# Patient Record
Sex: Male | Born: 1945
Health system: Southern US, Community
[De-identification: ages and names within clinical notes are randomized; demographics above are authoritative.]

## PROBLEM LIST (undated history)

## (undated) DIAGNOSIS — I10 Essential (primary) hypertension: Secondary | ICD-10-CM

## (undated) DIAGNOSIS — L503 Dermatographic urticaria: Secondary | ICD-10-CM

## (undated) DIAGNOSIS — K900 Celiac disease: Secondary | ICD-10-CM

## (undated) DIAGNOSIS — N529 Male erectile dysfunction, unspecified: Secondary | ICD-10-CM

## (undated) DIAGNOSIS — K635 Polyp of colon: Secondary | ICD-10-CM

## (undated) DIAGNOSIS — K219 Gastro-esophageal reflux disease without esophagitis: Secondary | ICD-10-CM

## (undated) HISTORY — DX: Dermatographic urticaria: L50.3

## (undated) HISTORY — PX: HERNIA REPAIR: SHX51

## (undated) HISTORY — DX: Celiac disease: K90.0

## (undated) HISTORY — DX: Polyp of colon: K63.5

## (undated) HISTORY — DX: Essential (primary) hypertension: I10

## (undated) HISTORY — PX: ESOPHAGOGASTRODUODENOSCOPY: SHX1529

## (undated) HISTORY — DX: Male erectile dysfunction, unspecified: N52.9

## (undated) HISTORY — DX: Gastro-esophageal reflux disease without esophagitis: K21.9

## (undated) HISTORY — PX: NOSE SURGERY: SHX723

## (undated) HISTORY — PX: CATARACT EXTRACTION: SUR2

---

## 1999-02-12 ENCOUNTER — Ambulatory Visit (HOSPITAL_BASED_OUTPATIENT_CLINIC_OR_DEPARTMENT_OTHER): Admission: RE | Admit: 1999-02-12 | Discharge: 1999-02-12 | Payer: Self-pay | Admitting: General Surgery

## 2001-10-30 ENCOUNTER — Encounter: Payer: Self-pay | Admitting: *Deleted

## 2001-10-30 ENCOUNTER — Ambulatory Visit (HOSPITAL_COMMUNITY): Admission: RE | Admit: 2001-10-30 | Discharge: 2001-10-30 | Payer: Self-pay | Admitting: *Deleted

## 2004-08-09 ENCOUNTER — Encounter: Admission: RE | Admit: 2004-08-09 | Discharge: 2004-11-07 | Payer: Self-pay | Admitting: Dermatology

## 2004-10-22 ENCOUNTER — Encounter: Admission: RE | Admit: 2004-10-22 | Discharge: 2004-10-22 | Payer: Self-pay | Admitting: Gastroenterology

## 2008-03-28 ENCOUNTER — Emergency Department (HOSPITAL_COMMUNITY): Admission: EM | Admit: 2008-03-28 | Discharge: 2008-03-28 | Payer: Self-pay | Admitting: Emergency Medicine

## 2010-07-04 ENCOUNTER — Ambulatory Visit
Admission: RE | Admit: 2010-07-04 | Discharge: 2010-07-04 | Payer: Self-pay | Source: Home / Self Care | Attending: Family Medicine | Admitting: Family Medicine

## 2010-08-13 ENCOUNTER — Other Ambulatory Visit: Payer: Self-pay | Admitting: Gastroenterology

## 2010-10-01 ENCOUNTER — Other Ambulatory Visit: Payer: Self-pay | Admitting: Gastroenterology

## 2010-10-01 ENCOUNTER — Ambulatory Visit
Admission: RE | Admit: 2010-10-01 | Discharge: 2010-10-01 | Disposition: A | Discharge status: Home / Self Care | Payer: Managed Care, Other (non HMO) | Source: Ambulatory Visit | Attending: Gastroenterology | Admitting: Gastroenterology

## 2010-10-01 DIAGNOSIS — R05 Cough: Secondary | ICD-10-CM

## 2010-10-01 DIAGNOSIS — R062 Wheezing: Secondary | ICD-10-CM

## 2010-10-26 ENCOUNTER — Encounter: Payer: Self-pay | Admitting: Internal Medicine

## 2010-10-29 ENCOUNTER — Encounter: Payer: Self-pay | Admitting: Internal Medicine

## 2010-10-29 ENCOUNTER — Ambulatory Visit (INDEPENDENT_AMBULATORY_CARE_PROVIDER_SITE_OTHER): Payer: Managed Care, Other (non HMO) | Admitting: Internal Medicine

## 2010-10-29 VITALS — BP 122/78 | HR 61 | Temp 97.8°F | Ht 70.0 in | Wt 187.0 lb

## 2010-10-29 DIAGNOSIS — R0600 Dyspnea, unspecified: Secondary | ICD-10-CM | POA: Insufficient documentation

## 2010-10-29 DIAGNOSIS — R0989 Other specified symptoms and signs involving the circulatory and respiratory systems: Secondary | ICD-10-CM

## 2010-10-29 DIAGNOSIS — K219 Gastro-esophageal reflux disease without esophagitis: Secondary | ICD-10-CM

## 2010-10-29 DIAGNOSIS — R05 Cough: Secondary | ICD-10-CM | POA: Insufficient documentation

## 2010-10-29 NOTE — Patient Instructions (Signed)
GERD (REFLUX)  is an extremely common cause of respiratory symptoms, many times with no significant heartburn at all.    It can be treated with medication, but also with lifestyle changes including avoidance of late meals, excessive alcohol, smoking cessation, and avoid fatty foods, chocolate, peppermint, colas, red wine, and acidic juices such as orange juice.  NO MINT OR MENTHOL PRODUCTS SO NO COUGH DROPS  USE SUGARLESS CANDY INSTEAD (jolley ranchers or Stover's)  NO OIL BASED VITAMINS   Stay on omeprazole Take 30- 60 min before your first and last meals of the day   Symbicort 2 puffs every 12 hours if needed for cough or breathing.    Stop zyrtec and use as needed for itching sneezing runny nose  Work on inhaler technique:  relax and gently blow all the way out then take a nice smooth deep breath back in, triggering the inhaler at same time you start breathing in.  Hold for up to 5 seconds if you can.  Rinse and gargle with water when done   If your mouth or throat starts to bother you,   I suggest you time the inhaler to your dental care and after using the inhaler(s) brush teeth and tongue with a baking soda containing toothpaste and when you rinse this out, gargle with it first to see if this helps your mouth and throat.      If you are not satisfied with control of cough or breathing please return here asap.

## 2010-10-29 NOTE — Progress Notes (Signed)
  Subjective:    Patient ID: Russell Thompson, male    DOB: 01/14/1946, 65 y.o.   MRN: [id number]  HPI   Primary= [facility]  =  [place]  38 yowm never smoker started having respiratory problems in 2010 sporadic but worsedry cough and chest tightness with variable sob in March and September better on inhalers assoc with bad GERD eval rx by [facility]   10/29/2010 Initial pulmonary office eval for sporadic cough that has resolved x 2 weeks-  Pt unable to answer any questions in a direct manor, even yes or no, appears to lack insight into his symptoms x for the timing "always in March and September",  Not convinced any meds make any difference and therefore tends to stop them.  At present he has no symptoms at all on symbicort but is unable to demonstrate more than 10% effective technique.   At present Pt denies any significant sore throat, dysphagia, itching, sneezing,  nasal congestion or excess/ purulent secretions,  fever, chills, sweats, unintended wt loss, pleuritic or exertional cp, hempoptysis, orthopnea pnd or leg swelling.    Also denies any obvious fluctuation of symptoms with weather or environmental changes or other aggravating or alleviating factors.   .  Review of Systems  Constitutional: Negative for fever, chills, activity change, appetite change and unexpected weight change.  HENT: Positive for congestion and sore throat. Negative for rhinorrhea, sneezing, trouble swallowing, dental problem, voice change and postnasal drip.   Eyes: Negative for visual disturbance.  Respiratory: Positive for cough and shortness of breath. Negative for choking.   Cardiovascular: Negative for chest pain and leg swelling.  Gastrointestinal: Positive for abdominal pain. Negative for nausea and vomiting.  Genitourinary: Negative for difficulty urinating.  Musculoskeletal: Negative for arthralgias.  Skin: Negative for rash.  Psychiatric/Behavioral: Negative for behavioral problems and confusion.        Objective:   Physical Exam    pleasant wm with unusual affect and attititude nad    Wt 187 10/29/10 HEENT: nl dentition, turbinates, and orophanx. Nl external ear canals without cough reflex   NECK :  without JVD/Nodes/TM/ nl carotid upstrokes bilaterally   LUNGS: no acc muscle use, clear to A and P bilaterally without cough on insp or exp maneuvers   CV:  RRR  no s3 or murmur or increase in P2, no edema   ABD:  soft and nontender with nl excursion in the supine position. No bruits or organomegaly, bowel sounds nl  MS:  warm without deformities, calf tenderness, cyanosis or clubbing  SKIN: warm and dry without lesions    NEURO:  alert, approp, no deficits     cxr 10/01/10  Reviewed, unrmarkable Assessment & Plan:

## 2010-10-30 ENCOUNTER — Encounter: Payer: Self-pay | Admitting: Internal Medicine

## 2010-10-30 DIAGNOSIS — K219 Gastro-esophageal reflux disease without esophagitis: Secondary | ICD-10-CM | POA: Insufficient documentation

## 2010-10-30 NOTE — Assessment & Plan Note (Signed)
Explained natural history of uri and why it's necessary in patients at risk to treat GERD aggressively  at least  short term   to reduce risk of evolving cyclical cough initially  triggered by epithelial injury and a heightened sensitivty to the effects of any upper airway irritants,  most importantly acid - related.  That is, the more sensitive the epithelium damaged for virus, the more the cough, the more the secondary reflux (especially in those prone to reflux) the more the irritation of the sensitive mucosa and so on in a cyclical pattern.  

## 2010-10-30 NOTE — Assessment & Plan Note (Addendum)
Symptoms are markedly disproportionate to objective findings and not clear this is a lung problem but pt does appear to have difficult airway management issues suggestive of intermittent difficult to control asthma.  DDX of  difficult airways managment all start with A and  include Adherence, Ace Inhibitors, Acid Reflux, Active Sinus Disease, Alpha 1 Antitripsin deficiency, Anxiety masquerading as Airways dz,  ABPA,  allergy(esp in young), Aspiration (esp in elderly), Adverse effects of DPI,  Active smokers, plus two Bs  = Bronchiectasis and Beta blocker use..and one C= CHF   Adherence is always the initial "prime suspect" and is a multilayered concern that requires a "trust but verify" approach in every patient - starting with knowing how to use medications, especially inhalers, correctly, keeping up with refills and understanding the fundamental difference between maintenance and prns vs those medications only taken for a very short course and then stopped and not refilled.   Ok with me for him to use symbicort prn, the way it is used in [place], since states only has problems in March and September, but needs help on mdi.  The proper method of use, as well as anticipated side effects, of this metered-dose inhaler are discussed and demonstrated to the patient. This improved from10% to 50% but no better, but since convinced the 10% worked, 50% should work 5 x as well.  "Acid reflux" (GERD)  may be playing a role, since not addressing non Acid component. Reinforced diet.  .Chronic cough is often simultaneously caused by more than one condition. A single cause has been found from 38 to 82% of the time, multiple causes from 18 to 62%. Multiply caused cough has been the result of three diseases up to 42% of the time.     Therefore Beta blockers and allergies could be destabilizing the airway and if fails to respond to above try \ Bystolic, the most beta -1  selective Beta blocker available in sample form,  with bisoprolol the most selective generic choice  on the market.

## 2012-02-27 ENCOUNTER — Other Ambulatory Visit: Payer: Self-pay | Admitting: Dermatology

## 2012-06-30 ENCOUNTER — Other Ambulatory Visit (HOSPITAL_COMMUNITY): Payer: Self-pay | Admitting: Gastroenterology

## 2012-06-30 DIAGNOSIS — K219 Gastro-esophageal reflux disease without esophagitis: Secondary | ICD-10-CM

## 2012-06-30 DIAGNOSIS — R109 Unspecified abdominal pain: Secondary | ICD-10-CM

## 2012-07-08 ENCOUNTER — Encounter (HOSPITAL_COMMUNITY): Payer: Self-pay

## 2012-07-08 ENCOUNTER — Encounter (HOSPITAL_COMMUNITY)
Admission: RE | Admit: 2012-07-08 | Discharge: 2012-07-08 | Disposition: A | Discharge status: Home / Self Care | Payer: Managed Care, Other (non HMO) | Source: Ambulatory Visit | Attending: Gastroenterology | Admitting: Gastroenterology

## 2012-07-08 DIAGNOSIS — K219 Gastro-esophageal reflux disease without esophagitis: Secondary | ICD-10-CM | POA: Insufficient documentation

## 2012-07-08 DIAGNOSIS — R109 Unspecified abdominal pain: Secondary | ICD-10-CM | POA: Insufficient documentation

## 2012-07-08 MED ORDER — TECHNETIUM TC 99M SULFUR COLLOID
2.1000 | Freq: Once | INTRAVENOUS | Status: AC | PRN
  Administered 2012-07-08: 2.1 via ORAL

## 2012-08-06 ENCOUNTER — Other Ambulatory Visit: Payer: Self-pay | Admitting: Gastroenterology

## 2012-08-06 ENCOUNTER — Ambulatory Visit
Admission: RE | Admit: 2012-08-06 | Discharge: 2012-08-06 | Disposition: A | Discharge status: Home / Self Care | Payer: Managed Care, Other (non HMO) | Source: Ambulatory Visit | Attending: Gastroenterology | Admitting: Gastroenterology

## 2012-08-06 DIAGNOSIS — R062 Wheezing: Secondary | ICD-10-CM

## 2012-08-06 DIAGNOSIS — R05 Cough: Secondary | ICD-10-CM

## 2012-10-09 ENCOUNTER — Other Ambulatory Visit: Payer: Self-pay | Admitting: Family Medicine

## 2012-10-09 DIAGNOSIS — M79662 Pain in left lower leg: Secondary | ICD-10-CM

## 2012-10-12 ENCOUNTER — Ambulatory Visit
Admission: RE | Admit: 2012-10-12 | Discharge: 2012-10-12 | Disposition: A | Discharge status: Home / Self Care | Payer: Managed Care, Other (non HMO) | Source: Ambulatory Visit | Attending: Family Medicine | Admitting: Family Medicine

## 2012-10-12 DIAGNOSIS — M79662 Pain in left lower leg: Secondary | ICD-10-CM

## 2013-05-31 ENCOUNTER — Other Ambulatory Visit: Payer: Self-pay | Admitting: Gastroenterology

## 2013-05-31 DIAGNOSIS — R109 Unspecified abdominal pain: Secondary | ICD-10-CM

## 2013-06-03 ENCOUNTER — Encounter (HOSPITAL_COMMUNITY): Payer: Self-pay | Admitting: Emergency Medicine

## 2013-06-03 ENCOUNTER — Ambulatory Visit
Admission: RE | Admit: 2013-06-03 | Discharge: 2013-06-03 | Disposition: A | Discharge status: Home / Self Care | Payer: Medicare Other | Source: Ambulatory Visit | Attending: Gastroenterology | Admitting: Gastroenterology

## 2013-06-03 ENCOUNTER — Emergency Department (HOSPITAL_COMMUNITY)
Admission: EM | Admit: 2013-06-03 | Discharge: 2013-06-03 | Disposition: A | Discharge status: Home / Self Care | Payer: Medicare Other | Attending: Emergency Medicine | Admitting: Emergency Medicine

## 2013-06-03 DIAGNOSIS — R42 Dizziness and giddiness: Secondary | ICD-10-CM | POA: Insufficient documentation

## 2013-06-03 DIAGNOSIS — R55 Syncope and collapse: Secondary | ICD-10-CM | POA: Insufficient documentation

## 2013-06-03 DIAGNOSIS — T50995A Adverse effect of other drugs, medicaments and biological substances, initial encounter: Secondary | ICD-10-CM | POA: Insufficient documentation

## 2013-06-03 DIAGNOSIS — K219 Gastro-esophageal reflux disease without esophagitis: Secondary | ICD-10-CM | POA: Insufficient documentation

## 2013-06-03 DIAGNOSIS — K900 Celiac disease: Secondary | ICD-10-CM | POA: Insufficient documentation

## 2013-06-03 DIAGNOSIS — Z888 Allergy status to other drugs, medicaments and biological substances status: Secondary | ICD-10-CM | POA: Insufficient documentation

## 2013-06-03 DIAGNOSIS — T508X5A Adverse effect of diagnostic agents, initial encounter: Secondary | ICD-10-CM

## 2013-06-03 DIAGNOSIS — R109 Unspecified abdominal pain: Secondary | ICD-10-CM

## 2013-06-03 DIAGNOSIS — N529 Male erectile dysfunction, unspecified: Secondary | ICD-10-CM | POA: Insufficient documentation

## 2013-06-03 DIAGNOSIS — I10 Essential (primary) hypertension: Secondary | ICD-10-CM | POA: Insufficient documentation

## 2013-06-03 DIAGNOSIS — R32 Unspecified urinary incontinence: Secondary | ICD-10-CM | POA: Insufficient documentation

## 2013-06-03 DIAGNOSIS — R159 Full incontinence of feces: Secondary | ICD-10-CM | POA: Insufficient documentation

## 2013-06-03 DIAGNOSIS — I959 Hypotension, unspecified: Secondary | ICD-10-CM | POA: Insufficient documentation

## 2013-06-03 DIAGNOSIS — T782XXA Anaphylactic shock, unspecified, initial encounter: Secondary | ICD-10-CM

## 2013-06-03 DIAGNOSIS — Z79899 Other long term (current) drug therapy: Secondary | ICD-10-CM | POA: Insufficient documentation

## 2013-06-03 DIAGNOSIS — T783XXA Angioneurotic edema, initial encounter: Secondary | ICD-10-CM | POA: Insufficient documentation

## 2013-06-03 DIAGNOSIS — Z860100 Personal history of colon polyps, unspecified: Secondary | ICD-10-CM | POA: Insufficient documentation

## 2013-06-03 DIAGNOSIS — R21 Rash and other nonspecific skin eruption: Secondary | ICD-10-CM | POA: Insufficient documentation

## 2013-06-03 DIAGNOSIS — R112 Nausea with vomiting, unspecified: Secondary | ICD-10-CM | POA: Insufficient documentation

## 2013-06-03 DIAGNOSIS — Z8719 Personal history of other diseases of the digestive system: Secondary | ICD-10-CM | POA: Insufficient documentation

## 2013-06-03 LAB — CBC WITH DIFFERENTIAL/PLATELET
Hemoglobin: 15.2 g/dL (ref 13.0–17.0)
Lymphocytes Relative: 24 % (ref 12–46)
Lymphs Abs: 2.8 10*3/uL (ref 0.7–4.0)
MCH: 30.2 pg (ref 26.0–34.0)
Monocytes Relative: 5 % (ref 3–12)
Neutro Abs: 7.8 10*3/uL — ABNORMAL HIGH (ref 1.7–7.7)
Neutrophils Relative %: 68 % (ref 43–77)
Platelets: 250 10*3/uL (ref 150–400)
RBC: 5.04 MIL/uL (ref 4.22–5.81)
WBC: 11.5 10*3/uL — ABNORMAL HIGH (ref 4.0–10.5)

## 2013-06-03 LAB — COMPREHENSIVE METABOLIC PANEL
ALT: 19 U/L (ref 0–53)
Alkaline Phosphatase: 92 U/L (ref 39–117)
BUN: 15 mg/dL (ref 6–23)
CO2: 21 mEq/L (ref 19–32)
Chloride: 100 mEq/L (ref 96–112)
GFR calc Af Amer: 83 mL/min — ABNORMAL LOW (ref >90)
Glucose, Bld: 120 mg/dL — ABNORMAL HIGH (ref 70–99)
Potassium: 3.8 mEq/L (ref 3.5–5.1)
Sodium: 134 mEq/L — ABNORMAL LOW (ref 135–145)
Total Bilirubin: 0.4 mg/dL (ref 0.3–1.2)
Total Protein: 6.7 g/dL (ref 6.0–8.3)

## 2013-06-03 MED ORDER — IOHEXOL 300 MG/ML  SOLN
100.0000 mL | Freq: Once | INTRAMUSCULAR | Status: AC | PRN
  Administered 2013-06-03: 100 mL via INTRAVENOUS

## 2013-06-03 MED ORDER — DIPHENHYDRAMINE HCL 50 MG/ML IJ SOLN
25.0000 mg | Freq: Once | INTRAMUSCULAR | Status: AC
  Administered 2013-06-03: 25 mg via INTRAVENOUS
  Filled 2013-06-03: qty 1

## 2013-06-03 MED ORDER — IBUPROFEN 400 MG PO TABS
600.0000 mg | ORAL_TABLET | Freq: Once | ORAL | Status: AC
  Administered 2013-06-03: 600 mg via ORAL
  Filled 2013-06-03 (×2): qty 1

## 2013-06-03 MED ORDER — EPINEPHRINE 0.3 MG/0.3ML IJ SOAJ
0.3000 mg | Freq: Once | INTRAMUSCULAR | Status: AC
  Administered 2013-06-03: 0.3 mg via INTRAMUSCULAR
  Filled 2013-06-03: qty 0.3

## 2013-06-03 NOTE — ED Provider Notes (Signed)
CSN: [id number]     Arrival date & time 06/03/13  1130 History   First MD Initiated Contact with Patient 06/03/13 1143     Chief Complaint  Patient presents with  . Allergic Reaction   (Consider location/radiation/quality/duration/timing/severity/associated sxs/prior Treatment) Patient is a 67 y.o. male presenting with allergic reaction. The history is provided by the patient. No language interpreter was used.  Allergic Reaction Presenting symptoms: no difficulty breathing, no difficulty swallowing and no wheezing   Presenting symptoms comment:  Syncope. Erythematous skin from head to toes.  Pt is a 67 year old male who presents via [facility] s/p syncopal episode, possible allergic reaction from contrast dye. Report from [facility] relays that pt had completed his infused CT and felt like he needed to use the bathroom. He stood up at bedside and became syncopal with reported loss of bowel and bladder. EMS reported hypotension initially of 60/47. He denies any difficulty breathing, shortness of breath, chest pain or swelling of his mouth or lips. He denies itching but reports that his skin has been really red and it has a burning, irritated sensation. No nausea or vomiting reported. He denies any recent illness, fever or chills and has not had a reaction like this before. He reports that his GI doctor sent him for an abdominal CT but he is not sure what he was looking for. He denies abdominal pain or discomfort at this time.      Past Medical History  Diagnosis Date  . Hypertension   . GERD (gastroesophageal reflux disease)   . Esophagitis   . Dermatographism   . Concussion     x 5 in High School  . Colon polyp   . Celiac sprue   . ED (erectile dysfunction)    Past Surgical History  Procedure Laterality Date  . Nose surgery    . Hernia repair    . Cataract extraction      Bilateral  . Esophagogastroduodenoscopy     Family History  Problem Relation Age of Onset  . Colon  polyps Father   . Lung cancer Father   . Cervical cancer Mother    History  Substance Use Topics  . Smoking status: Never Smoker   . Smokeless tobacco: Never Used  . Alcohol Use: No    Review of Systems  Constitutional: Negative for fever and chills.  HENT: Negative for sore throat and trouble swallowing.   Respiratory: Negative for choking, chest tightness, shortness of breath, wheezing and stridor.   Cardiovascular: Negative for chest pain.  Gastrointestinal: Negative for abdominal pain and abdominal distention.  Skin: Positive for color change.  All other systems reviewed and are negative.    Allergies  Review of patient's allergies indicates no known allergies.  Home Medications   Current Outpatient Rx  Name  Route  Sig  Dispense  Refill  . Ascorbic Acid (VITAMIN C) 500 MG tablet   Oral   Take 500 mg by mouth daily.           Marland Kitchen atenolol (TENORMIN) 50 MG tablet   Oral   Take 50 mg by mouth daily.           . budesonide-formoterol (SYMBICORT) 160-4.5 MCG/ACT inhaler   Inhalation   Inhale 2 puffs into the lungs 2 (two) times daily as needed.           . cetirizine (ZYRTEC) 10 MG tablet   Oral   Take 10 mg by mouth daily.           Marland Kitchen  erythromycin (ERY-TAB) 250 MG EC tablet   Oral   Take 250 mg by mouth daily.         Marland Kitchen omeprazole (PRILOSEC) 40 MG capsule   Oral   Take 40 mg by mouth 2 (two) times daily.           . Tadalafil (CIALIS) 2.5 MG TABS   Oral   Take 1 tablet by mouth daily as needed.            BP 127/75  Pulse 76  Resp 15  Ht 5\' 10"  (1.778 m)  Wt 198 lb (89.812 kg)  BMI 28.41 kg/m2  SpO2 97% Physical Exam  Nursing note and vitals reviewed. Constitutional: He is oriented to person, place, and time. He appears well-developed and well-nourished. No distress.  HENT:  Head: Normocephalic and atraumatic.  Mouth/Throat: Oropharynx is clear and moist.  Eyes: Conjunctivae and EOM are normal. Pupils are equal, round, and reactive  to light.  Neck: Normal range of motion. Neck supple. No JVD present. No tracheal deviation present. No thyromegaly present.  Cardiovascular: Normal rate, regular rhythm, normal heart sounds and intact distal pulses.   Pulmonary/Chest: Effort normal and breath sounds normal. No stridor. No respiratory distress. He has no wheezes. He has no rales.  Abdominal: Soft. Bowel sounds are normal. He exhibits no distension. There is no tenderness.  Musculoskeletal: Normal range of motion.  Lymphadenopathy:    He has no cervical adenopathy.  Neurological: He is alert and oriented to person, place, and time. He has normal reflexes.  Skin: There is erythema.  Erythema all over, no edema noted.   Psychiatric: He has a normal mood and affect. His behavior is normal. Judgment and thought content normal.    ED Course  Procedures (including critical care time) Labs Review Labs Reviewed  CBC WITH DIFFERENTIAL  COMPREHENSIVE METABOLIC PANEL   Imaging Review Ct Abdomen Pelvis W Contrast  06/03/2013   CLINICAL DATA:  Abdominal pain  EXAM: CT ABDOMEN AND PELVIS WITH CONTRAST  TECHNIQUE: Multidetector CT imaging of the abdomen and pelvis was performed using the standard protocol following bolus administration of intravenous contrast.  CONTRAST:  100 cc Omnipaque 300  COMPARISON:  None.  FINDINGS: The lung bases are clear with some hyper aeration present. A moderate-sized hiatal hernia is noted. The liver enhances with no focal abnormality and no ductal dilatation is seen. No calcified gallstones are noted within the contracted gallbladder. The pancreas is normal in size and tortuous rectosigmoid colon. The appendix and terminal ileum appear normal.  The pancreatic duct is not dilated. The adrenal glands and spleen are unremarkable. The stomach is otherwise unremarkable the kidneys enhance with no calculus or mass and on delayed images, the pelvocaliceal systems are unremarkable and the proximal ureters are normal  in caliber. The abdominal aorta is normal caliber. No adenopathy is seen.  The urinary bladder is moderately urine distended with no abnormality noted. The prostate is minimally prominent. The rectosigmoid colon is somewhat tortuous, but no obvious colonic abnormality is noted. Much of the colon is decompressed and difficult to evaluate. Terminal ileum is faintly visualized and is unremarkable, and the appendix appears normal, extending cephalad. The lumbar vertebrae are in normal alignment with mild degenerative disc disease at L5-S1.  IMPRESSION: 1. Moderate size hiatal hernia. 2. The appendix and terminal ileum are unremarkable.  3. After completion of the CT, the patient became syncopal losing bladder control. An IV was restarted as well as oxygen, and EMS was  contacted. The patient seen to be stable and was then transferred to the emergency department for further care.   Electronically Signed   By: Dwyane Dee M.D.   On: 06/03/2013 11:15    EKG Interpretation   None       MDM   1. Allergic reaction to contrast dye, initial encounter    Syncopal episode with loss of bowel and bladder, erythematous skin that has a burning and irritated sensation. No edema, shortness of breath or difficulty swallowing. Pt given epi pen 0.3 mg, benadryl 25 mg IV and NS 1 liter bolus. Observed for 5-6 hours after epi pen administration. Pt reports that his symptoms have resolved and he is feeling much better. Pt feels like he is ready to go home. He denies any chest pain, shortness of breath, difficulty breathing or itching. Erythema has also completely resolved. VS stable. No tachycardia, tachypnea or hypoxia.     Irish Elders, NP 06/04/13 1406

## 2013-06-03 NOTE — ED Notes (Addendum)
Pt drank oral contrast and had a syncope episode with Hypotension , stool and urinary incont. Pt was started on Nasal O2 because of O2 sats of 90% on room air.Pt  vitals reported to EMS were  B/P 60/47 hoarseness 84. O2 sats 90%. On arrival to ED Pt trunk ,arms and legs appeared flushed and warm to touch.Russell Thompson

## 2013-06-03 NOTE — ED Provider Notes (Signed)
Medical screening examination/treatment/procedure(s) were conducted as a shared visit with non-physician practitioner(s) and myself.  I personally evaluated the patient during the encounter.  EKG Interpretation   None       67 year old male with an apparent anaphylactic reaction after receiving IV contrast for abdominal CT scan performed at an imaging center. Shortly after the scan was completed, he felt nauseated vomited had a syncopal episode, and became very flushed with red skin.  On my evaluation, still dizzy, skin still flushed, still slightly nauseated. Plan epinephrine for treatment of anaphylaxis.  Will observe in ED.  Clinical Impression: 1. Allergic reaction to contrast dye, initial encounter   2. Anaphylaxis, initial encounter       Candyce Churn, MD 06/04/13 0800

## 2013-06-04 NOTE — ED Provider Notes (Signed)
Medical screening examination/treatment/procedure(s) were conducted as a shared visit with non-physician practitioner(s) and myself.  I personally evaluated the patient during the encounter.   Please see my separate note.     Candyce Churn, MD 06/04/13 [phone]

## 2013-06-30 IMAGING — CR DG CHEST 2V
2 series · 2 of 2 positions shown · non-contrast
Comparison: 10/01/2010

CLINICAL DATA: Cough with wheezing and vomiting at night for 4
days.  Nonsmoker

CHEST - 2 VIEW

[w chest pa]
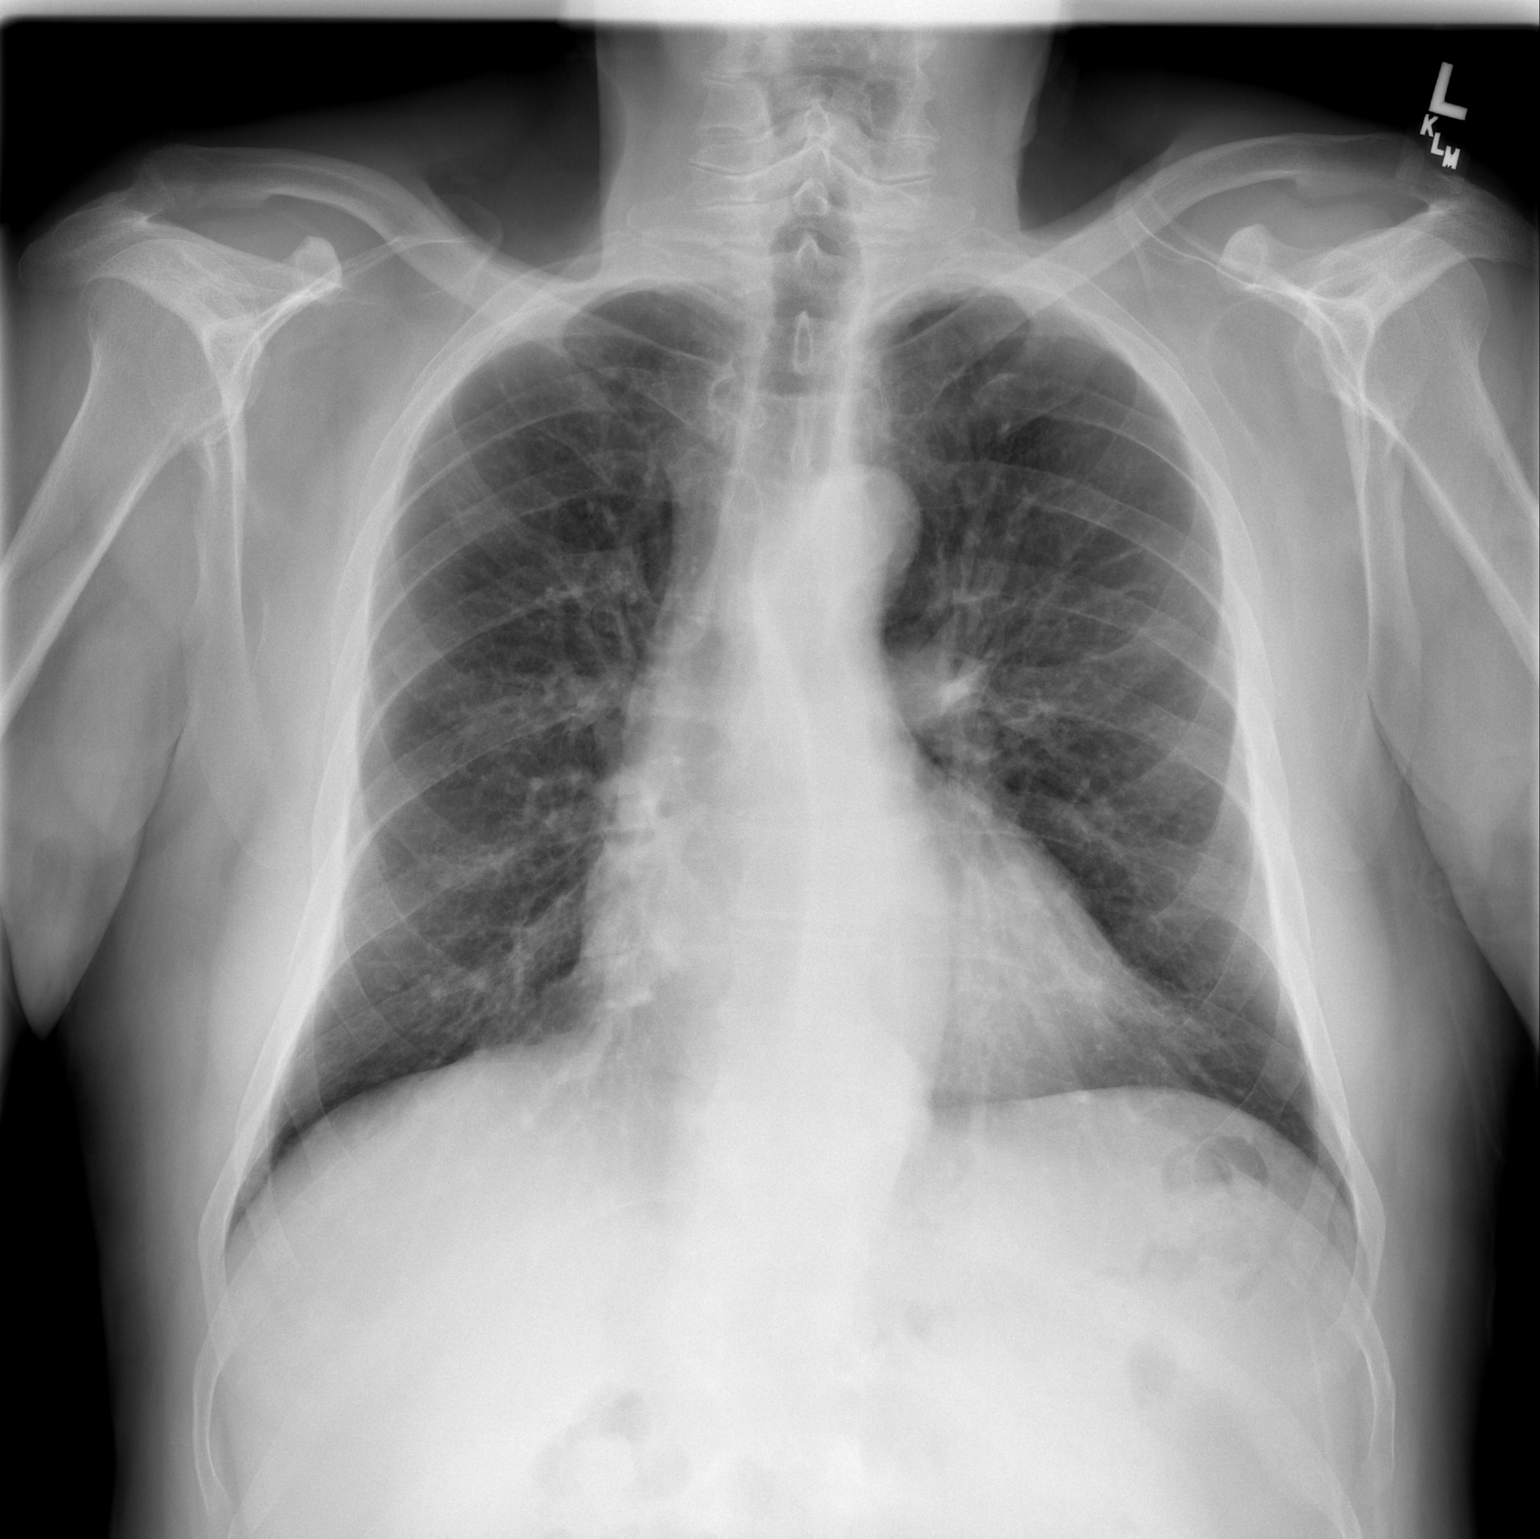

[w chest lat]
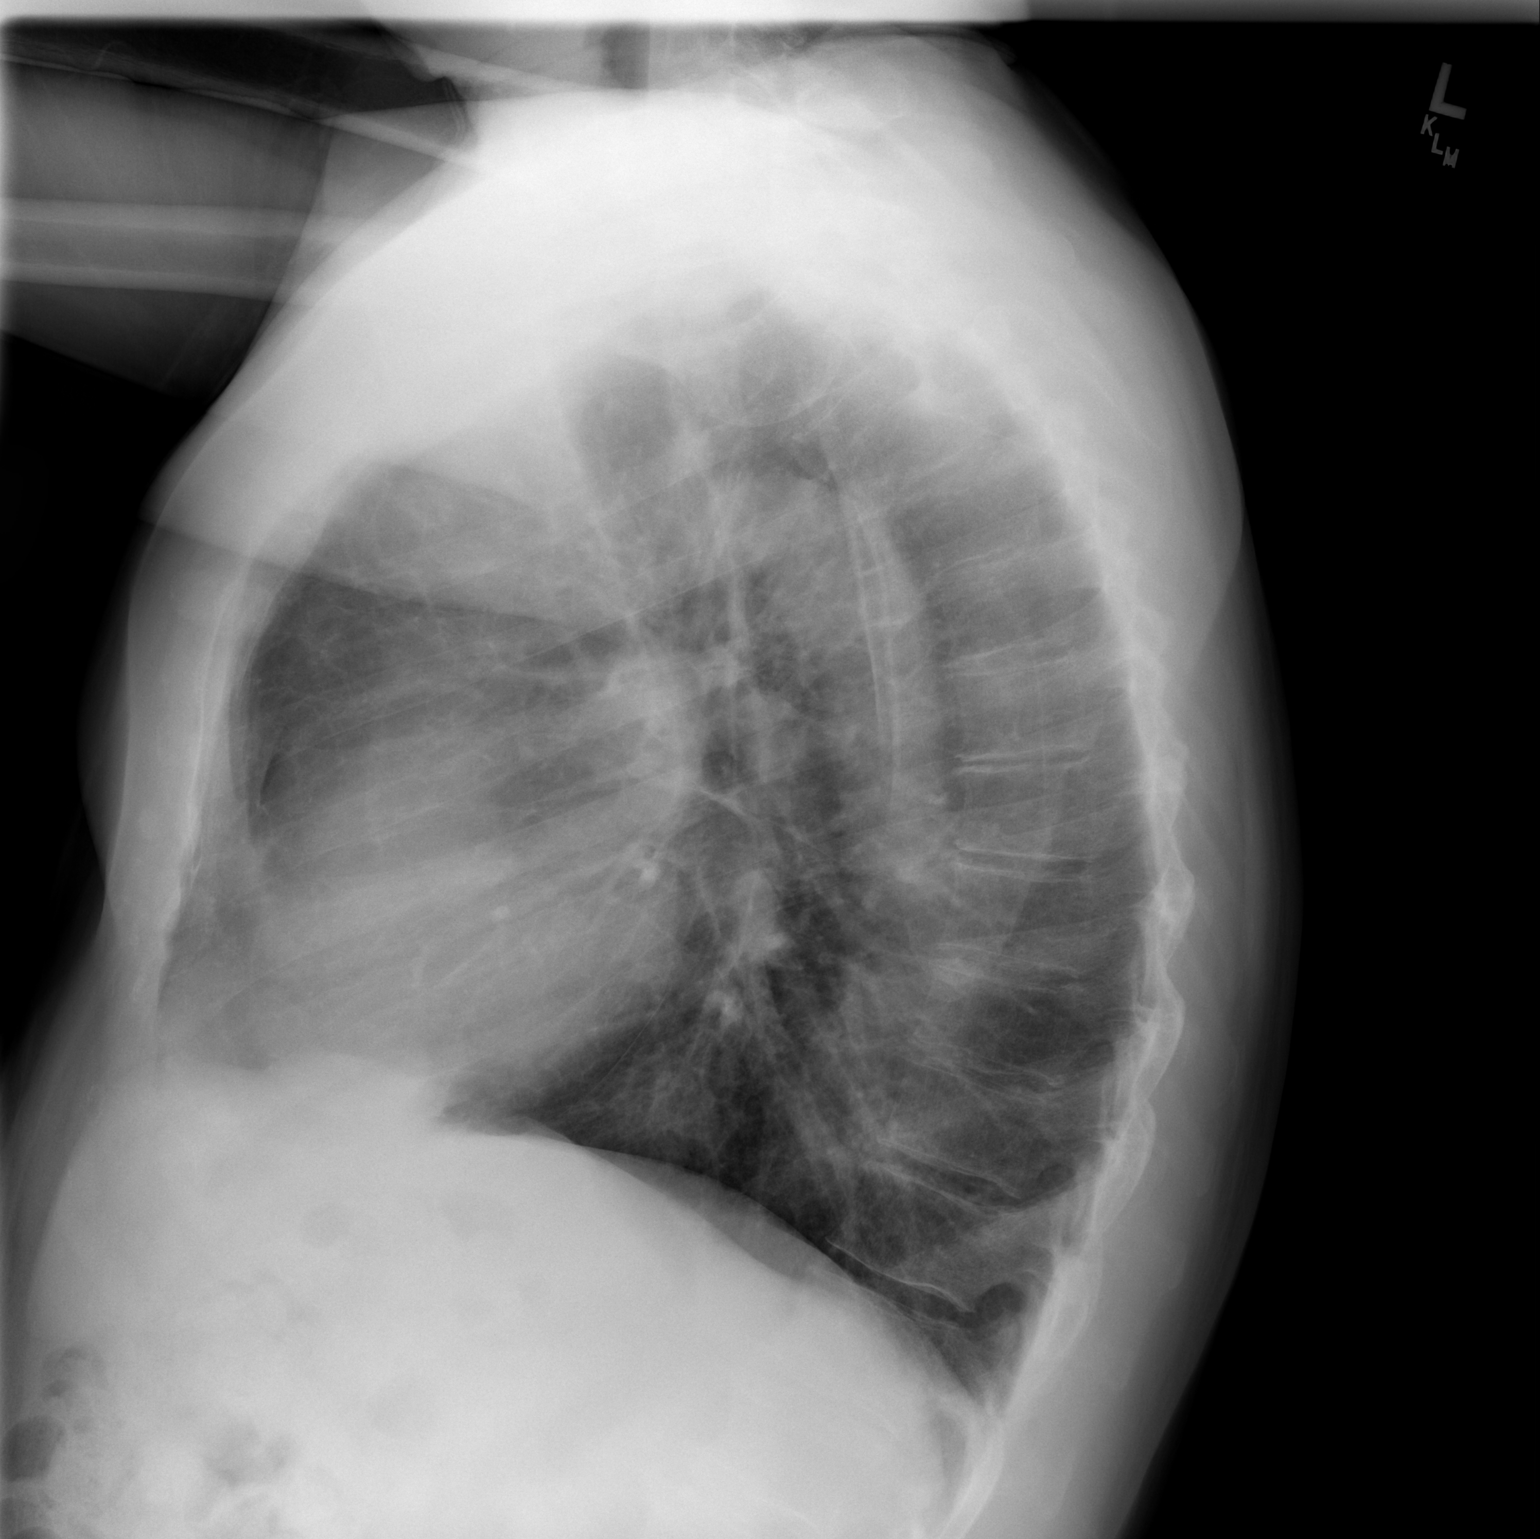

[2 of 2 positions shown; findings below may reference images not displayed]

FINDINGS: Changes of underlying COPD are evident.  Taking this into
consideration, heart and mediastinal contours are stable.  A small
sliding variety hiatal hernia is again noted and mild ectasia of
the thoracic aorta is evident.

The lung fields demonstrate an increase in interstitial markings
diffusely compatible with underlying bronchitic change.  This
finding is stable.  No focal infiltrates or signs of congestive
failure are suggested.  No pleural fluid is seen.  No increase in
central peribronchial markings are noted.

Mild bilateral apical pleural thickening is unchanged.

Bony structures are intact.
IMPRESSION: Unchanged COPD and underlying bronchitic change.  No new focal or
acute abnormality identified.

## 2014-08-10 ENCOUNTER — Other Ambulatory Visit: Payer: Self-pay | Admitting: Gastroenterology

## 2015-07-05 DIAGNOSIS — R05 Cough: Secondary | ICD-10-CM | POA: Diagnosis not present

## 2015-07-05 DIAGNOSIS — L298 Other pruritus: Secondary | ICD-10-CM | POA: Diagnosis not present

## 2015-09-07 DIAGNOSIS — K219 Gastro-esophageal reflux disease without esophagitis: Secondary | ICD-10-CM | POA: Diagnosis not present

## 2015-09-07 DIAGNOSIS — I10 Essential (primary) hypertension: Secondary | ICD-10-CM | POA: Diagnosis not present

## 2015-09-07 DIAGNOSIS — L309 Dermatitis, unspecified: Secondary | ICD-10-CM | POA: Diagnosis not present

## 2015-09-07 DIAGNOSIS — R05 Cough: Secondary | ICD-10-CM | POA: Diagnosis not present

## 2015-10-26 DIAGNOSIS — L130 Dermatitis herpetiformis: Secondary | ICD-10-CM | POA: Diagnosis not present

## 2015-10-26 DIAGNOSIS — L821 Other seborrheic keratosis: Secondary | ICD-10-CM | POA: Diagnosis not present

## 2015-10-26 DIAGNOSIS — L570 Actinic keratosis: Secondary | ICD-10-CM | POA: Diagnosis not present

## 2015-10-26 DIAGNOSIS — Z85828 Personal history of other malignant neoplasm of skin: Secondary | ICD-10-CM | POA: Diagnosis not present

## 2015-11-14 DIAGNOSIS — H26491 Other secondary cataract, right eye: Secondary | ICD-10-CM | POA: Diagnosis not present

## 2015-11-14 DIAGNOSIS — H40013 Open angle with borderline findings, low risk, bilateral: Secondary | ICD-10-CM | POA: Diagnosis not present

## 2015-11-14 DIAGNOSIS — Z961 Presence of intraocular lens: Secondary | ICD-10-CM | POA: Diagnosis not present

## 2015-11-16 DIAGNOSIS — Z961 Presence of intraocular lens: Secondary | ICD-10-CM | POA: Diagnosis not present

## 2015-11-16 DIAGNOSIS — H26491 Other secondary cataract, right eye: Secondary | ICD-10-CM | POA: Diagnosis not present

## 2016-01-29 DIAGNOSIS — L508 Other urticaria: Secondary | ICD-10-CM | POA: Diagnosis not present

## 2016-02-26 DIAGNOSIS — M76891 Other specified enthesopathies of right lower limb, excluding foot: Secondary | ICD-10-CM | POA: Diagnosis not present

## 2016-03-11 DIAGNOSIS — I10 Essential (primary) hypertension: Secondary | ICD-10-CM | POA: Diagnosis not present

## 2016-03-11 DIAGNOSIS — Z125 Encounter for screening for malignant neoplasm of prostate: Secondary | ICD-10-CM | POA: Diagnosis not present

## 2016-03-11 DIAGNOSIS — F5101 Primary insomnia: Secondary | ICD-10-CM | POA: Diagnosis not present

## 2016-03-11 DIAGNOSIS — B356 Tinea cruris: Secondary | ICD-10-CM | POA: Diagnosis not present

## 2016-03-11 DIAGNOSIS — K219 Gastro-esophageal reflux disease without esophagitis: Secondary | ICD-10-CM | POA: Diagnosis not present

## 2016-03-11 DIAGNOSIS — Z23 Encounter for immunization: Secondary | ICD-10-CM | POA: Diagnosis not present

## 2016-03-11 DIAGNOSIS — R05 Cough: Secondary | ICD-10-CM | POA: Diagnosis not present

## 2016-04-10 DIAGNOSIS — Z961 Presence of intraocular lens: Secondary | ICD-10-CM | POA: Diagnosis not present

## 2016-04-10 DIAGNOSIS — H40013 Open angle with borderline findings, low risk, bilateral: Secondary | ICD-10-CM | POA: Diagnosis not present

## 2016-05-01 DIAGNOSIS — L570 Actinic keratosis: Secondary | ICD-10-CM | POA: Diagnosis not present

## 2016-05-01 DIAGNOSIS — Z23 Encounter for immunization: Secondary | ICD-10-CM | POA: Diagnosis not present

## 2016-05-01 DIAGNOSIS — Z85828 Personal history of other malignant neoplasm of skin: Secondary | ICD-10-CM | POA: Diagnosis not present

## 2016-05-01 DIAGNOSIS — L130 Dermatitis herpetiformis: Secondary | ICD-10-CM | POA: Diagnosis not present

## 2016-05-01 DIAGNOSIS — L503 Dermatographic urticaria: Secondary | ICD-10-CM | POA: Diagnosis not present

## 2016-05-01 DIAGNOSIS — D1800 Hemangioma unspecified site: Secondary | ICD-10-CM | POA: Diagnosis not present

## 2016-05-01 DIAGNOSIS — D225 Melanocytic nevi of trunk: Secondary | ICD-10-CM | POA: Diagnosis not present

## 2016-09-09 DIAGNOSIS — I10 Essential (primary) hypertension: Secondary | ICD-10-CM | POA: Diagnosis not present

## 2016-09-09 DIAGNOSIS — K219 Gastro-esophageal reflux disease without esophagitis: Secondary | ICD-10-CM | POA: Diagnosis not present

## 2016-09-09 DIAGNOSIS — B356 Tinea cruris: Secondary | ICD-10-CM | POA: Diagnosis not present

## 2016-10-29 DIAGNOSIS — L280 Lichen simplex chronicus: Secondary | ICD-10-CM | POA: Diagnosis not present

## 2016-10-29 DIAGNOSIS — L821 Other seborrheic keratosis: Secondary | ICD-10-CM | POA: Diagnosis not present

## 2016-10-29 DIAGNOSIS — L309 Dermatitis, unspecified: Secondary | ICD-10-CM | POA: Diagnosis not present

## 2016-10-29 DIAGNOSIS — Z85828 Personal history of other malignant neoplasm of skin: Secondary | ICD-10-CM | POA: Diagnosis not present

## 2016-10-29 DIAGNOSIS — D1800 Hemangioma unspecified site: Secondary | ICD-10-CM | POA: Diagnosis not present

## 2016-10-29 DIAGNOSIS — D225 Melanocytic nevi of trunk: Secondary | ICD-10-CM | POA: Diagnosis not present

## 2016-12-02 DIAGNOSIS — K900 Celiac disease: Secondary | ICD-10-CM | POA: Diagnosis not present

## 2016-12-02 DIAGNOSIS — K219 Gastro-esophageal reflux disease without esophagitis: Secondary | ICD-10-CM | POA: Diagnosis not present

## 2017-02-20 DIAGNOSIS — Z1389 Encounter for screening for other disorder: Secondary | ICD-10-CM | POA: Diagnosis not present

## 2017-02-20 DIAGNOSIS — R600 Localized edema: Secondary | ICD-10-CM | POA: Diagnosis not present

## 2017-02-20 DIAGNOSIS — R52 Pain, unspecified: Secondary | ICD-10-CM | POA: Diagnosis not present

## 2017-03-11 DIAGNOSIS — I10 Essential (primary) hypertension: Secondary | ICD-10-CM | POA: Diagnosis not present

## 2017-03-11 DIAGNOSIS — Z23 Encounter for immunization: Secondary | ICD-10-CM | POA: Diagnosis not present

## 2017-03-11 DIAGNOSIS — Z125 Encounter for screening for malignant neoplasm of prostate: Secondary | ICD-10-CM | POA: Diagnosis not present

## 2017-03-11 DIAGNOSIS — K219 Gastro-esophageal reflux disease without esophagitis: Secondary | ICD-10-CM | POA: Diagnosis not present

## 2017-04-16 DIAGNOSIS — Z961 Presence of intraocular lens: Secondary | ICD-10-CM | POA: Diagnosis not present

## 2017-04-16 DIAGNOSIS — H40013 Open angle with borderline findings, low risk, bilateral: Secondary | ICD-10-CM | POA: Diagnosis not present

## 2017-07-14 DIAGNOSIS — J0100 Acute maxillary sinusitis, unspecified: Secondary | ICD-10-CM | POA: Diagnosis not present

## 2017-09-08 DIAGNOSIS — K219 Gastro-esophageal reflux disease without esophagitis: Secondary | ICD-10-CM | POA: Diagnosis not present

## 2017-09-08 DIAGNOSIS — N5202 Corporo-venous occlusive erectile dysfunction: Secondary | ICD-10-CM | POA: Diagnosis not present

## 2017-09-08 DIAGNOSIS — I10 Essential (primary) hypertension: Secondary | ICD-10-CM | POA: Diagnosis not present

## 2017-09-08 DIAGNOSIS — I872 Venous insufficiency (chronic) (peripheral): Secondary | ICD-10-CM | POA: Diagnosis not present

## 2017-09-16 DIAGNOSIS — R05 Cough: Secondary | ICD-10-CM | POA: Diagnosis not present

## 2017-11-05 DIAGNOSIS — D1800 Hemangioma unspecified site: Secondary | ICD-10-CM | POA: Diagnosis not present

## 2017-11-05 DIAGNOSIS — L723 Sebaceous cyst: Secondary | ICD-10-CM | POA: Diagnosis not present

## 2017-11-05 DIAGNOSIS — D225 Melanocytic nevi of trunk: Secondary | ICD-10-CM | POA: Diagnosis not present

## 2017-11-05 DIAGNOSIS — L570 Actinic keratosis: Secondary | ICD-10-CM | POA: Diagnosis not present

## 2017-11-05 DIAGNOSIS — Z85828 Personal history of other malignant neoplasm of skin: Secondary | ICD-10-CM | POA: Diagnosis not present

## 2017-11-05 DIAGNOSIS — B353 Tinea pedis: Secondary | ICD-10-CM | POA: Diagnosis not present

## 2017-11-05 DIAGNOSIS — D239 Other benign neoplasm of skin, unspecified: Secondary | ICD-10-CM | POA: Diagnosis not present

## 2017-11-05 DIAGNOSIS — L821 Other seborrheic keratosis: Secondary | ICD-10-CM | POA: Diagnosis not present

## 2017-12-18 DIAGNOSIS — K317 Polyp of stomach and duodenum: Secondary | ICD-10-CM | POA: Diagnosis not present

## 2017-12-18 DIAGNOSIS — K449 Diaphragmatic hernia without obstruction or gangrene: Secondary | ICD-10-CM | POA: Diagnosis not present

## 2017-12-18 DIAGNOSIS — K228 Other specified diseases of esophagus: Secondary | ICD-10-CM | POA: Diagnosis not present

## 2017-12-18 DIAGNOSIS — K219 Gastro-esophageal reflux disease without esophagitis: Secondary | ICD-10-CM | POA: Diagnosis not present

## 2017-12-18 DIAGNOSIS — K5710 Diverticulosis of small intestine without perforation or abscess without bleeding: Secondary | ICD-10-CM | POA: Diagnosis not present

## 2017-12-24 DIAGNOSIS — K219 Gastro-esophageal reflux disease without esophagitis: Secondary | ICD-10-CM | POA: Diagnosis not present

## 2017-12-24 DIAGNOSIS — K317 Polyp of stomach and duodenum: Secondary | ICD-10-CM | POA: Diagnosis not present

## 2018-01-26 DIAGNOSIS — R600 Localized edema: Secondary | ICD-10-CM | POA: Diagnosis not present

## 2018-01-27 DIAGNOSIS — I82442 Acute embolism and thrombosis of left tibial vein: Secondary | ICD-10-CM | POA: Diagnosis not present

## 2018-01-28 ENCOUNTER — Other Ambulatory Visit: Payer: Self-pay | Admitting: Family Medicine

## 2018-01-28 ENCOUNTER — Other Ambulatory Visit (HOSPITAL_COMMUNITY): Payer: Self-pay | Admitting: Family Medicine

## 2018-01-28 DIAGNOSIS — I82442 Acute embolism and thrombosis of left tibial vein: Secondary | ICD-10-CM | POA: Diagnosis not present

## 2018-01-29 ENCOUNTER — Ambulatory Visit (HOSPITAL_COMMUNITY)
Admission: RE | Admit: 2018-01-29 | Discharge: 2018-01-29 | Disposition: A | Discharge status: Home / Self Care | Payer: PPO | Source: Ambulatory Visit | Attending: Family Medicine | Admitting: Family Medicine

## 2018-01-29 ENCOUNTER — Other Ambulatory Visit (HOSPITAL_COMMUNITY): Payer: Self-pay | Admitting: Family Medicine

## 2018-01-29 ENCOUNTER — Encounter (HOSPITAL_COMMUNITY)
Admission: RE | Admit: 2018-01-29 | Discharge: 2018-01-29 | Disposition: A | Discharge status: Home / Self Care | Payer: PPO | Source: Ambulatory Visit | Attending: Family Medicine | Admitting: Family Medicine

## 2018-01-29 DIAGNOSIS — I824Z2 Acute embolism and thrombosis of unspecified deep veins of left distal lower extremity: Secondary | ICD-10-CM

## 2018-01-29 DIAGNOSIS — R059 Cough, unspecified: Secondary | ICD-10-CM

## 2018-01-29 DIAGNOSIS — R05 Cough: Secondary | ICD-10-CM | POA: Insufficient documentation

## 2018-01-29 DIAGNOSIS — K449 Diaphragmatic hernia without obstruction or gangrene: Secondary | ICD-10-CM | POA: Diagnosis not present

## 2018-01-29 DIAGNOSIS — I82402 Acute embolism and thrombosis of unspecified deep veins of left lower extremity: Secondary | ICD-10-CM | POA: Diagnosis not present

## 2018-01-29 DIAGNOSIS — Z86718 Personal history of other venous thrombosis and embolism: Secondary | ICD-10-CM | POA: Insufficient documentation

## 2018-01-29 MED ORDER — TECHNETIUM TO 99M ALBUMIN AGGREGATED
4.2000 | Freq: Once | INTRAVENOUS | Status: AC
  Administered 2018-01-29: 4.2 via INTRAVENOUS

## 2018-01-29 MED ORDER — TECHNETIUM TC 99M DIETHYLENETRIAME-PENTAACETIC ACID
31.4000 | Freq: Once | INTRAVENOUS | Status: AC
  Administered 2018-01-29: 31.4 via RESPIRATORY_TRACT

## 2018-03-11 DIAGNOSIS — N5202 Corporo-venous occlusive erectile dysfunction: Secondary | ICD-10-CM | POA: Diagnosis not present

## 2018-03-11 DIAGNOSIS — I10 Essential (primary) hypertension: Secondary | ICD-10-CM | POA: Diagnosis not present

## 2018-03-11 DIAGNOSIS — K219 Gastro-esophageal reflux disease without esophagitis: Secondary | ICD-10-CM | POA: Diagnosis not present

## 2018-03-11 DIAGNOSIS — Z125 Encounter for screening for malignant neoplasm of prostate: Secondary | ICD-10-CM | POA: Diagnosis not present

## 2018-03-11 DIAGNOSIS — I82442 Acute embolism and thrombosis of left tibial vein: Secondary | ICD-10-CM | POA: Diagnosis not present

## 2018-03-11 DIAGNOSIS — Z136 Encounter for screening for cardiovascular disorders: Secondary | ICD-10-CM | POA: Diagnosis not present

## 2018-03-11 DIAGNOSIS — I872 Venous insufficiency (chronic) (peripheral): Secondary | ICD-10-CM | POA: Diagnosis not present

## 2018-04-16 DIAGNOSIS — H40013 Open angle with borderline findings, low risk, bilateral: Secondary | ICD-10-CM | POA: Diagnosis not present

## 2018-04-16 DIAGNOSIS — Z961 Presence of intraocular lens: Secondary | ICD-10-CM | POA: Diagnosis not present

## 2018-04-20 DIAGNOSIS — Z23 Encounter for immunization: Secondary | ICD-10-CM | POA: Diagnosis not present

## 2018-04-20 DIAGNOSIS — I872 Venous insufficiency (chronic) (peripheral): Secondary | ICD-10-CM | POA: Diagnosis not present

## 2018-04-20 DIAGNOSIS — Z86718 Personal history of other venous thrombosis and embolism: Secondary | ICD-10-CM | POA: Diagnosis not present

## 2018-10-20 DIAGNOSIS — J309 Allergic rhinitis, unspecified: Secondary | ICD-10-CM | POA: Diagnosis not present

## 2018-10-20 DIAGNOSIS — K219 Gastro-esophageal reflux disease without esophagitis: Secondary | ICD-10-CM | POA: Diagnosis not present

## 2018-10-20 DIAGNOSIS — I10 Essential (primary) hypertension: Secondary | ICD-10-CM | POA: Diagnosis not present

## 2018-11-04 DIAGNOSIS — L271 Localized skin eruption due to drugs and medicaments taken internally: Secondary | ICD-10-CM | POA: Diagnosis not present

## 2018-11-04 DIAGNOSIS — D225 Melanocytic nevi of trunk: Secondary | ICD-10-CM | POA: Diagnosis not present

## 2018-11-04 DIAGNOSIS — L570 Actinic keratosis: Secondary | ICD-10-CM | POA: Diagnosis not present

## 2018-11-04 DIAGNOSIS — Z85828 Personal history of other malignant neoplasm of skin: Secondary | ICD-10-CM | POA: Diagnosis not present

## 2018-11-04 DIAGNOSIS — D239 Other benign neoplasm of skin, unspecified: Secondary | ICD-10-CM | POA: Diagnosis not present

## 2018-11-04 DIAGNOSIS — L723 Sebaceous cyst: Secondary | ICD-10-CM | POA: Diagnosis not present

## 2018-11-04 DIAGNOSIS — L821 Other seborrheic keratosis: Secondary | ICD-10-CM | POA: Diagnosis not present

## 2019-01-14 DIAGNOSIS — D509 Iron deficiency anemia, unspecified: Secondary | ICD-10-CM | POA: Diagnosis not present

## 2019-01-14 DIAGNOSIS — N400 Enlarged prostate without lower urinary tract symptoms: Secondary | ICD-10-CM | POA: Diagnosis not present

## 2019-01-14 DIAGNOSIS — I10 Essential (primary) hypertension: Secondary | ICD-10-CM | POA: Diagnosis not present

## 2019-02-18 DIAGNOSIS — D509 Iron deficiency anemia, unspecified: Secondary | ICD-10-CM | POA: Diagnosis not present

## 2019-02-18 DIAGNOSIS — N400 Enlarged prostate without lower urinary tract symptoms: Secondary | ICD-10-CM | POA: Diagnosis not present

## 2019-02-18 DIAGNOSIS — I10 Essential (primary) hypertension: Secondary | ICD-10-CM | POA: Diagnosis not present

## 2019-04-19 DIAGNOSIS — K219 Gastro-esophageal reflux disease without esophagitis: Secondary | ICD-10-CM | POA: Diagnosis not present

## 2019-04-19 DIAGNOSIS — Z23 Encounter for immunization: Secondary | ICD-10-CM | POA: Diagnosis not present

## 2019-04-19 DIAGNOSIS — Z125 Encounter for screening for malignant neoplasm of prostate: Secondary | ICD-10-CM | POA: Diagnosis not present

## 2019-04-19 DIAGNOSIS — I10 Essential (primary) hypertension: Secondary | ICD-10-CM | POA: Diagnosis not present

## 2019-04-22 DIAGNOSIS — Z961 Presence of intraocular lens: Secondary | ICD-10-CM | POA: Diagnosis not present

## 2019-04-22 DIAGNOSIS — H40013 Open angle with borderline findings, low risk, bilateral: Secondary | ICD-10-CM | POA: Diagnosis not present

## 2019-07-28 ENCOUNTER — Ambulatory Visit: Payer: PPO

## 2019-08-06 ENCOUNTER — Ambulatory Visit: Payer: PPO

## 2019-08-20 DIAGNOSIS — D509 Iron deficiency anemia, unspecified: Secondary | ICD-10-CM | POA: Diagnosis not present

## 2019-08-20 DIAGNOSIS — N400 Enlarged prostate without lower urinary tract symptoms: Secondary | ICD-10-CM | POA: Diagnosis not present

## 2019-08-20 DIAGNOSIS — I10 Essential (primary) hypertension: Secondary | ICD-10-CM | POA: Diagnosis not present

## 2019-09-06 DIAGNOSIS — K219 Gastro-esophageal reflux disease without esophagitis: Secondary | ICD-10-CM | POA: Diagnosis not present

## 2019-09-06 DIAGNOSIS — Z86010 Personal history of colonic polyps: Secondary | ICD-10-CM | POA: Diagnosis not present

## 2019-09-06 DIAGNOSIS — K900 Celiac disease: Secondary | ICD-10-CM | POA: Diagnosis not present

## 2019-10-04 DIAGNOSIS — Z1159 Encounter for screening for other viral diseases: Secondary | ICD-10-CM | POA: Diagnosis not present

## 2019-10-06 DIAGNOSIS — K635 Polyp of colon: Secondary | ICD-10-CM | POA: Diagnosis not present

## 2019-10-06 DIAGNOSIS — K5730 Diverticulosis of large intestine without perforation or abscess without bleeding: Secondary | ICD-10-CM | POA: Diagnosis not present

## 2019-10-06 DIAGNOSIS — Z86010 Personal history of colonic polyps: Secondary | ICD-10-CM | POA: Diagnosis not present

## 2019-10-08 DIAGNOSIS — K635 Polyp of colon: Secondary | ICD-10-CM | POA: Diagnosis not present

## 2019-10-18 DIAGNOSIS — I10 Essential (primary) hypertension: Secondary | ICD-10-CM | POA: Diagnosis not present

## 2019-10-18 DIAGNOSIS — K219 Gastro-esophageal reflux disease without esophagitis: Secondary | ICD-10-CM | POA: Diagnosis not present

## 2019-11-05 DIAGNOSIS — D225 Melanocytic nevi of trunk: Secondary | ICD-10-CM | POA: Diagnosis not present

## 2019-11-05 DIAGNOSIS — L859 Epidermal thickening, unspecified: Secondary | ICD-10-CM | POA: Diagnosis not present

## 2019-11-05 DIAGNOSIS — L821 Other seborrheic keratosis: Secondary | ICD-10-CM | POA: Diagnosis not present

## 2019-11-05 DIAGNOSIS — Z85828 Personal history of other malignant neoplasm of skin: Secondary | ICD-10-CM | POA: Diagnosis not present

## 2019-11-05 DIAGNOSIS — L723 Sebaceous cyst: Secondary | ICD-10-CM | POA: Diagnosis not present

## 2019-11-05 DIAGNOSIS — L309 Dermatitis, unspecified: Secondary | ICD-10-CM | POA: Diagnosis not present

## 2019-11-05 DIAGNOSIS — L578 Other skin changes due to chronic exposure to nonionizing radiation: Secondary | ICD-10-CM | POA: Diagnosis not present

## 2020-01-10 DIAGNOSIS — J4521 Mild intermittent asthma with (acute) exacerbation: Secondary | ICD-10-CM | POA: Diagnosis not present

## 2020-01-18 ENCOUNTER — Other Ambulatory Visit: Payer: Self-pay

## 2020-01-18 ENCOUNTER — Other Ambulatory Visit (HOSPITAL_COMMUNITY): Payer: Self-pay | Admitting: Nurse Practitioner

## 2020-01-18 ENCOUNTER — Ambulatory Visit (HOSPITAL_COMMUNITY)
Admission: RE | Admit: 2020-01-18 | Discharge: 2020-01-18 | Disposition: A | Discharge status: Home / Self Care | Payer: PPO | Source: Ambulatory Visit | Attending: Family Medicine | Admitting: Family Medicine

## 2020-01-18 DIAGNOSIS — M7989 Other specified soft tissue disorders: Secondary | ICD-10-CM

## 2020-01-18 DIAGNOSIS — M79604 Pain in right leg: Secondary | ICD-10-CM | POA: Diagnosis not present

## 2020-01-18 NOTE — Progress Notes (Signed)
Right lower extremity venous duplex completed. Refer to "CV Proc" under chart review to view preliminary results.  01/18/2020 4:23 PM Kelby Aline., MHA, RVT, RDCS, RDMS

## 2020-04-18 DIAGNOSIS — I10 Essential (primary) hypertension: Secondary | ICD-10-CM | POA: Diagnosis not present

## 2020-04-18 DIAGNOSIS — Z23 Encounter for immunization: Secondary | ICD-10-CM | POA: Diagnosis not present

## 2020-04-18 DIAGNOSIS — N5202 Corporo-venous occlusive erectile dysfunction: Secondary | ICD-10-CM | POA: Diagnosis not present

## 2020-04-18 DIAGNOSIS — K219 Gastro-esophageal reflux disease without esophagitis: Secondary | ICD-10-CM | POA: Diagnosis not present

## 2020-04-27 DIAGNOSIS — H1045 Other chronic allergic conjunctivitis: Secondary | ICD-10-CM | POA: Diagnosis not present

## 2020-04-27 DIAGNOSIS — H40013 Open angle with borderline findings, low risk, bilateral: Secondary | ICD-10-CM | POA: Diagnosis not present

## 2020-04-27 DIAGNOSIS — H0102A Squamous blepharitis right eye, upper and lower eyelids: Secondary | ICD-10-CM | POA: Diagnosis not present

## 2020-04-27 DIAGNOSIS — H0102B Squamous blepharitis left eye, upper and lower eyelids: Secondary | ICD-10-CM | POA: Diagnosis not present

## 2020-04-27 DIAGNOSIS — Z961 Presence of intraocular lens: Secondary | ICD-10-CM | POA: Diagnosis not present

## 2020-06-28 DIAGNOSIS — Z20822 Contact with and (suspected) exposure to covid-19: Secondary | ICD-10-CM | POA: Diagnosis not present

## 2020-08-10 DIAGNOSIS — H00025 Hordeolum internum left lower eyelid: Secondary | ICD-10-CM | POA: Diagnosis not present

## 2020-10-17 DIAGNOSIS — R0789 Other chest pain: Secondary | ICD-10-CM | POA: Diagnosis not present

## 2020-10-17 DIAGNOSIS — Z0000 Encounter for general adult medical examination without abnormal findings: Secondary | ICD-10-CM | POA: Diagnosis not present

## 2020-10-17 DIAGNOSIS — I10 Essential (primary) hypertension: Secondary | ICD-10-CM | POA: Diagnosis not present

## 2020-11-07 DIAGNOSIS — D1801 Hemangioma of skin and subcutaneous tissue: Secondary | ICD-10-CM | POA: Diagnosis not present

## 2020-11-07 DIAGNOSIS — L570 Actinic keratosis: Secondary | ICD-10-CM | POA: Diagnosis not present

## 2020-11-07 DIAGNOSIS — D225 Melanocytic nevi of trunk: Secondary | ICD-10-CM | POA: Diagnosis not present

## 2020-11-07 DIAGNOSIS — Z85828 Personal history of other malignant neoplasm of skin: Secondary | ICD-10-CM | POA: Diagnosis not present

## 2020-11-07 DIAGNOSIS — L859 Epidermal thickening, unspecified: Secondary | ICD-10-CM | POA: Diagnosis not present

## 2020-11-07 DIAGNOSIS — L821 Other seborrheic keratosis: Secondary | ICD-10-CM | POA: Diagnosis not present

## 2020-11-07 DIAGNOSIS — L578 Other skin changes due to chronic exposure to nonionizing radiation: Secondary | ICD-10-CM | POA: Diagnosis not present

## 2020-11-07 DIAGNOSIS — L723 Sebaceous cyst: Secondary | ICD-10-CM | POA: Diagnosis not present

## 2021-04-19 DIAGNOSIS — I10 Essential (primary) hypertension: Secondary | ICD-10-CM | POA: Diagnosis not present

## 2021-04-19 DIAGNOSIS — L308 Other specified dermatitis: Secondary | ICD-10-CM | POA: Diagnosis not present

## 2021-04-19 DIAGNOSIS — K219 Gastro-esophageal reflux disease without esophagitis: Secondary | ICD-10-CM | POA: Diagnosis not present

## 2021-04-19 DIAGNOSIS — Z23 Encounter for immunization: Secondary | ICD-10-CM | POA: Diagnosis not present

## 2021-05-02 DIAGNOSIS — H1045 Other chronic allergic conjunctivitis: Secondary | ICD-10-CM | POA: Diagnosis not present

## 2021-05-02 DIAGNOSIS — H0102B Squamous blepharitis left eye, upper and lower eyelids: Secondary | ICD-10-CM | POA: Diagnosis not present

## 2021-05-02 DIAGNOSIS — H40013 Open angle with borderline findings, low risk, bilateral: Secondary | ICD-10-CM | POA: Diagnosis not present

## 2021-05-02 DIAGNOSIS — Z961 Presence of intraocular lens: Secondary | ICD-10-CM | POA: Diagnosis not present

## 2021-05-02 DIAGNOSIS — H0102A Squamous blepharitis right eye, upper and lower eyelids: Secondary | ICD-10-CM | POA: Diagnosis not present

## 2021-05-02 DIAGNOSIS — H04123 Dry eye syndrome of bilateral lacrimal glands: Secondary | ICD-10-CM | POA: Diagnosis not present

## 2021-05-03 ENCOUNTER — Other Ambulatory Visit: Payer: Self-pay | Admitting: Gastroenterology

## 2021-05-03 DIAGNOSIS — R109 Unspecified abdominal pain: Secondary | ICD-10-CM

## 2021-05-21 ENCOUNTER — Other Ambulatory Visit: Payer: Self-pay

## 2021-05-21 ENCOUNTER — Ambulatory Visit
Admission: RE | Admit: 2021-05-21 | Discharge: 2021-05-21 | Disposition: A | Discharge status: Home / Self Care | Payer: PPO | Source: Ambulatory Visit | Attending: Gastroenterology | Admitting: Gastroenterology

## 2021-05-21 DIAGNOSIS — R109 Unspecified abdominal pain: Secondary | ICD-10-CM | POA: Diagnosis not present

## 2021-05-21 DIAGNOSIS — K449 Diaphragmatic hernia without obstruction or gangrene: Secondary | ICD-10-CM | POA: Diagnosis not present

## 2021-05-21 DIAGNOSIS — K760 Fatty (change of) liver, not elsewhere classified: Secondary | ICD-10-CM | POA: Diagnosis not present

## 2021-05-21 DIAGNOSIS — K8020 Calculus of gallbladder without cholecystitis without obstruction: Secondary | ICD-10-CM | POA: Diagnosis not present

## 2021-06-04 DIAGNOSIS — J392 Other diseases of pharynx: Secondary | ICD-10-CM | POA: Diagnosis not present

## 2021-06-04 DIAGNOSIS — R051 Acute cough: Secondary | ICD-10-CM | POA: Diagnosis not present

## 2021-07-05 DIAGNOSIS — Z13810 Encounter for screening for upper gastrointestinal disorder: Secondary | ICD-10-CM | POA: Diagnosis not present

## 2021-07-05 DIAGNOSIS — K219 Gastro-esophageal reflux disease without esophagitis: Secondary | ICD-10-CM | POA: Diagnosis not present

## 2021-07-05 DIAGNOSIS — R1013 Epigastric pain: Secondary | ICD-10-CM | POA: Diagnosis not present

## 2021-07-05 DIAGNOSIS — K5710 Diverticulosis of small intestine without perforation or abscess without bleeding: Secondary | ICD-10-CM | POA: Diagnosis not present

## 2021-07-05 DIAGNOSIS — K317 Polyp of stomach and duodenum: Secondary | ICD-10-CM | POA: Diagnosis not present

## 2021-07-05 DIAGNOSIS — K449 Diaphragmatic hernia without obstruction or gangrene: Secondary | ICD-10-CM | POA: Diagnosis not present

## 2021-07-12 DIAGNOSIS — K317 Polyp of stomach and duodenum: Secondary | ICD-10-CM | POA: Diagnosis not present

## 2021-07-12 DIAGNOSIS — K219 Gastro-esophageal reflux disease without esophagitis: Secondary | ICD-10-CM | POA: Diagnosis not present

## 2021-10-22 DIAGNOSIS — I10 Essential (primary) hypertension: Secondary | ICD-10-CM | POA: Diagnosis not present

## 2021-10-22 DIAGNOSIS — L308 Other specified dermatitis: Secondary | ICD-10-CM | POA: Diagnosis not present

## 2021-10-22 DIAGNOSIS — Z0000 Encounter for general adult medical examination without abnormal findings: Secondary | ICD-10-CM | POA: Diagnosis not present

## 2021-10-22 DIAGNOSIS — R6882 Decreased libido: Secondary | ICD-10-CM | POA: Diagnosis not present

## 2021-10-22 DIAGNOSIS — K219 Gastro-esophageal reflux disease without esophagitis: Secondary | ICD-10-CM | POA: Diagnosis not present

## 2021-10-22 DIAGNOSIS — R109 Unspecified abdominal pain: Secondary | ICD-10-CM | POA: Diagnosis not present

## 2021-11-08 DIAGNOSIS — D1801 Hemangioma of skin and subcutaneous tissue: Secondary | ICD-10-CM | POA: Diagnosis not present

## 2021-11-08 DIAGNOSIS — Z85828 Personal history of other malignant neoplasm of skin: Secondary | ICD-10-CM | POA: Diagnosis not present

## 2021-11-08 DIAGNOSIS — L578 Other skin changes due to chronic exposure to nonionizing radiation: Secondary | ICD-10-CM | POA: Diagnosis not present

## 2021-11-08 DIAGNOSIS — L570 Actinic keratosis: Secondary | ICD-10-CM | POA: Diagnosis not present

## 2021-11-08 DIAGNOSIS — L821 Other seborrheic keratosis: Secondary | ICD-10-CM | POA: Diagnosis not present

## 2021-11-08 DIAGNOSIS — L130 Dermatitis herpetiformis: Secondary | ICD-10-CM | POA: Diagnosis not present

## 2021-11-08 DIAGNOSIS — L723 Sebaceous cyst: Secondary | ICD-10-CM | POA: Diagnosis not present

## 2021-11-08 DIAGNOSIS — D225 Melanocytic nevi of trunk: Secondary | ICD-10-CM | POA: Diagnosis not present

## 2022-04-14 IMAGING — CT CT ABD-PELV W/O CM
1 of 2 series · 14 of 32 positions shown, 19 images · non-contrast
Comparison: CT abdomen and pelvis 06/03/2013.

CLINICAL DATA: Mid abdomen pain and notes he can not lay on right
side with out extreme pain for awhile now.

EXAM:
CT ABDOMEN AND PELVIS WITHOUT CONTRAST
TECHNIQUE: Multidetector CT imaging of the abdomen and pelvis was performed
following the standard protocol without IV contrast.

[Series 2: abd/pelvis w/(date) · axial · 0.83mm/px · z∈[-498,-73]mm · 14 of 96 slices shown, 19 images]
[im 6/96  soft-tissue]
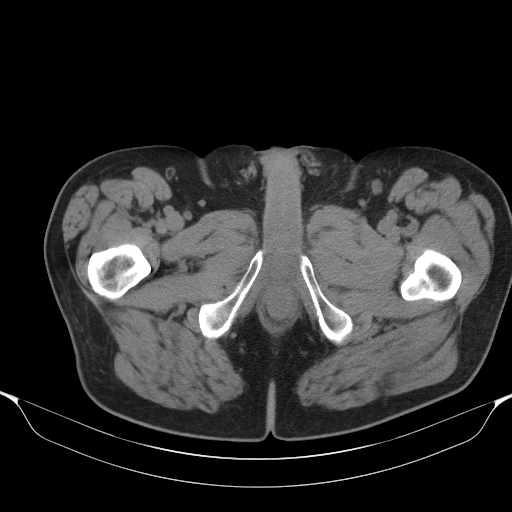
[im 6/96  bone]
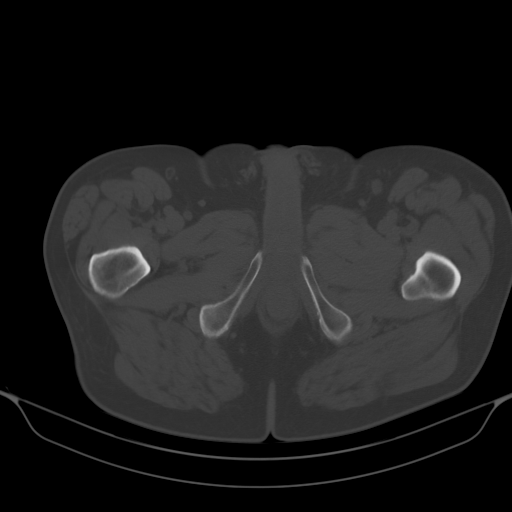
[im 16/96  soft-tissue]
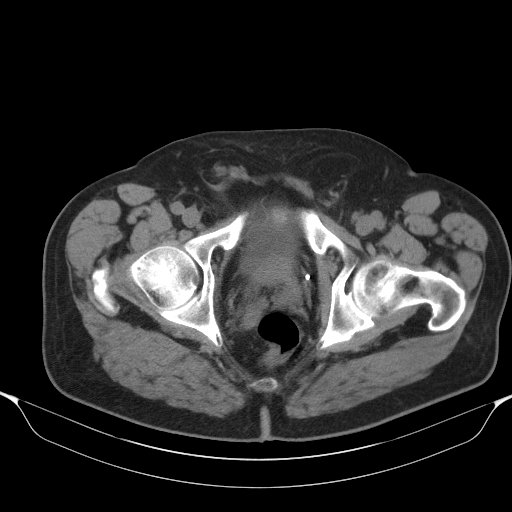
[im 21/96  soft-tissue]
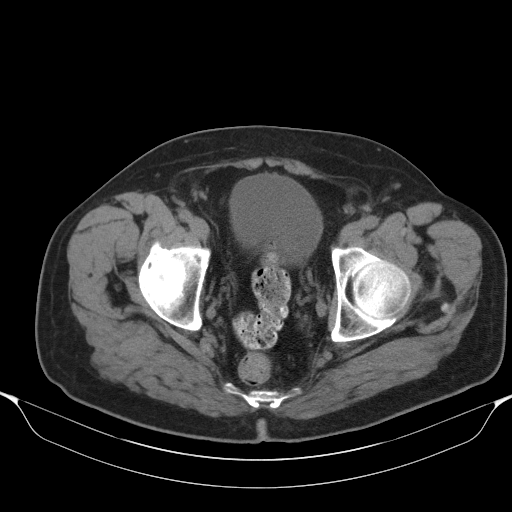
[im 26/96  soft-tissue]
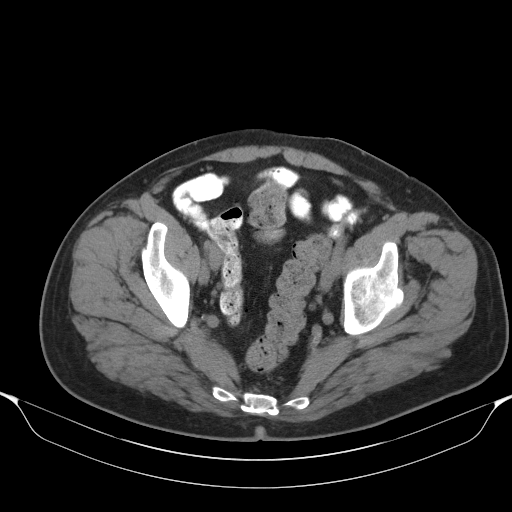
[im 36/96  soft-tissue]
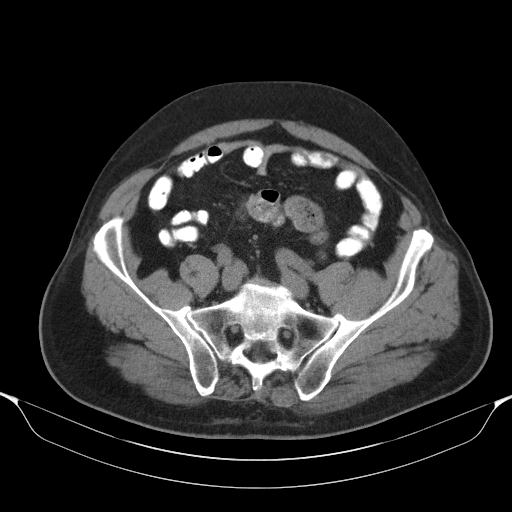
[im 41/96  soft-tissue]
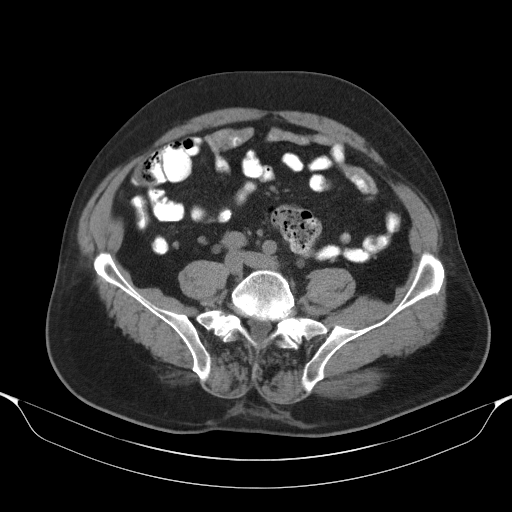
[im 51/96  soft-tissue]
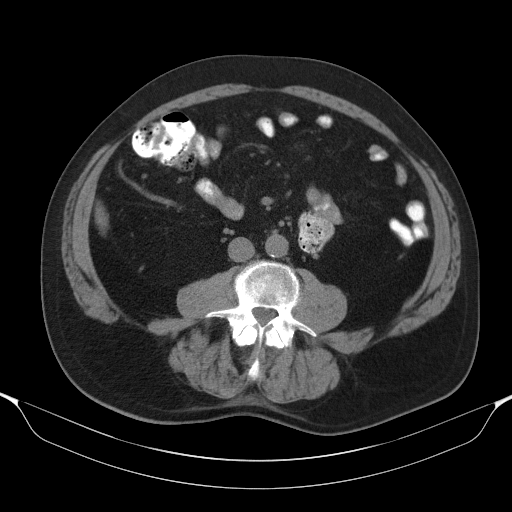
[im 56/96  soft-tissue]
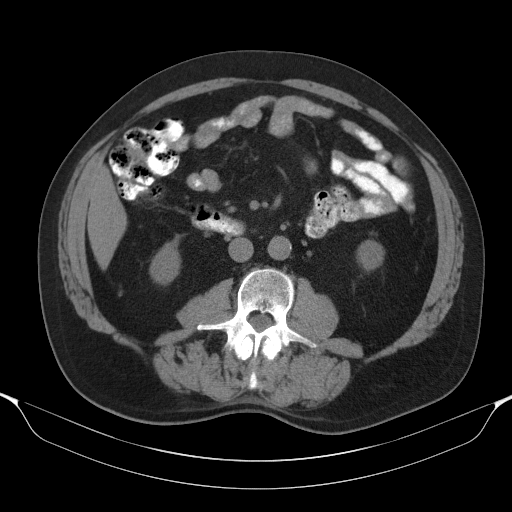
[im 61/96  soft-tissue]
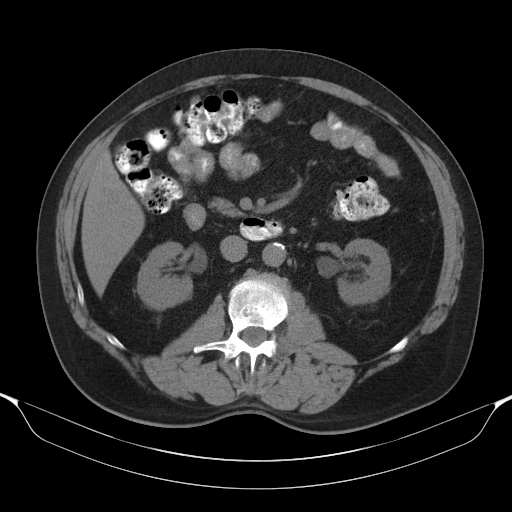
[im 61/96  bone]
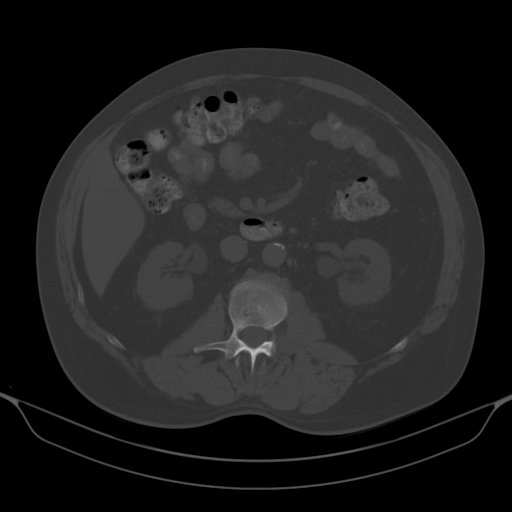
[im 71/96  soft-tissue]
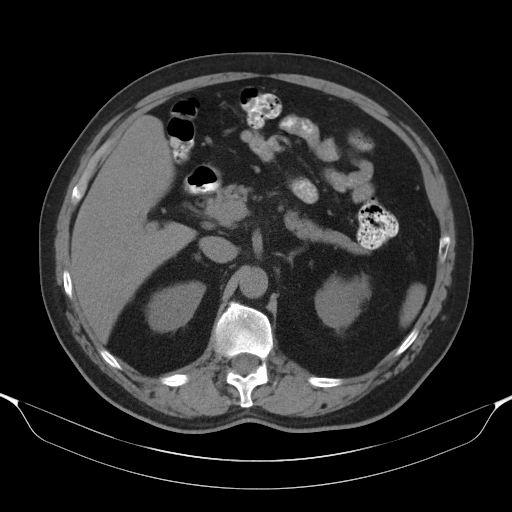
[im 76/96  soft-tissue]
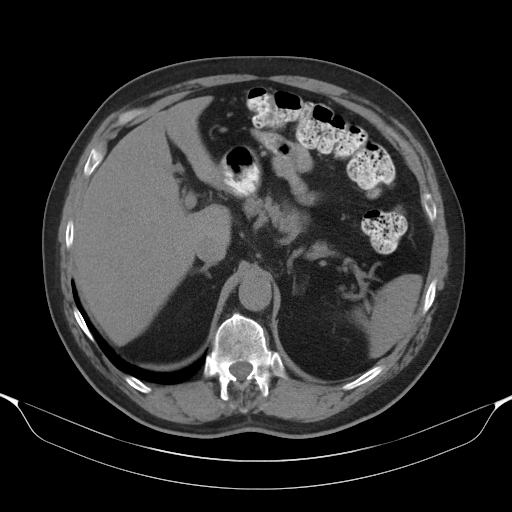
[im 76/96  lung]
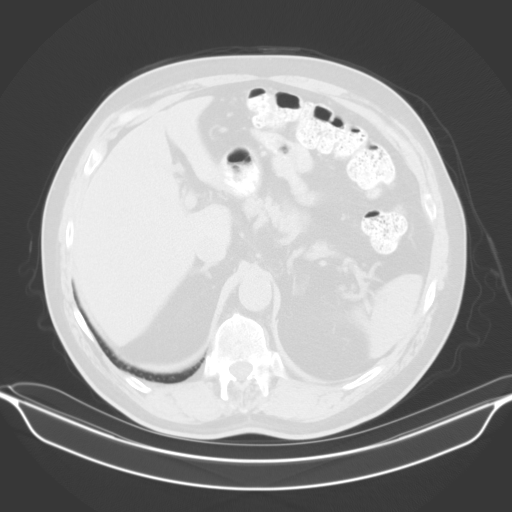
[im 81/96  soft-tissue]
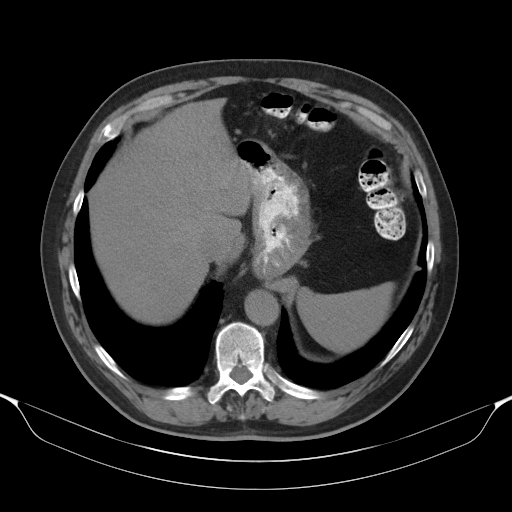
[im 81/96  lung]
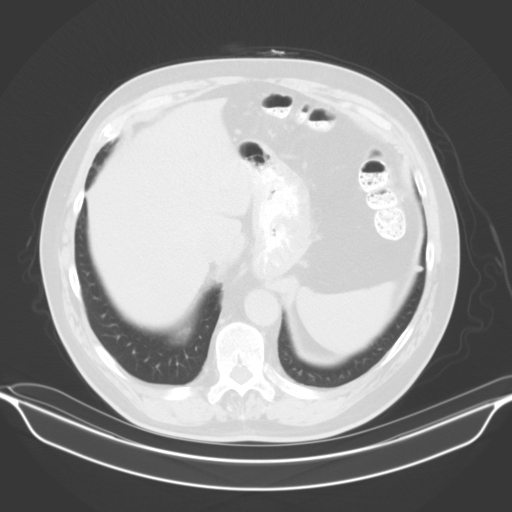
[im 86/96  lung]
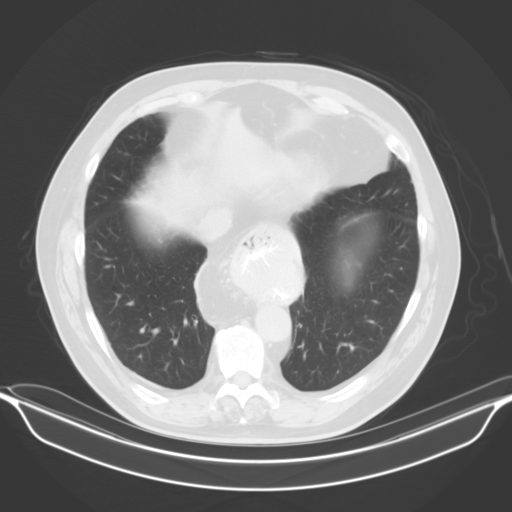
[im 91/96  soft-tissue]
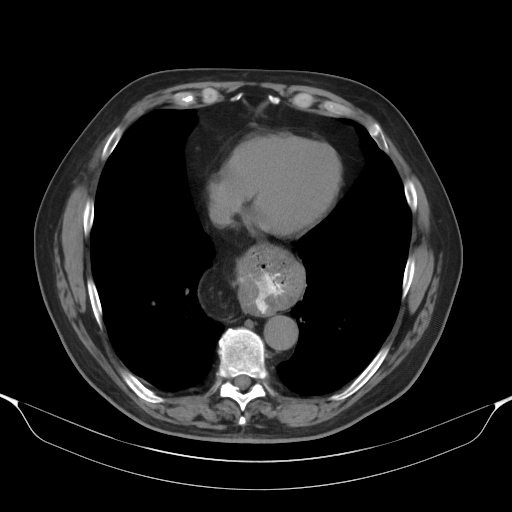
[im 91/96  lung]
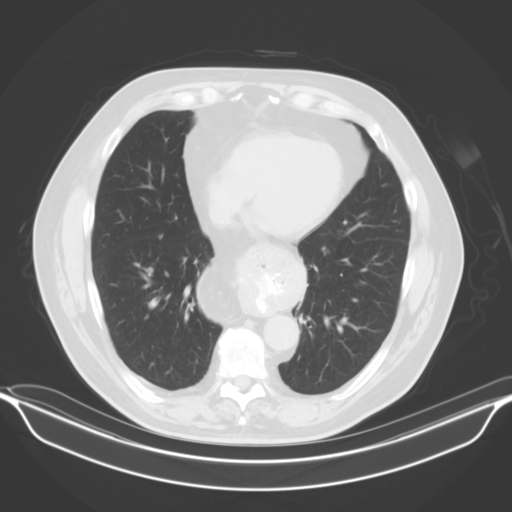

[14 of 32 positions shown; findings below may reference images not displayed]

FINDINGS: Lower chest: A few scattered small solid pulmonary nodules at both
lung bases, largest 4 mm in the peripheral basilar left lower lobe
(series 5/image 30), all stable since 06/03/2013 CT and considered
benign. No acute abnormality at the lung bases.

Hepatobiliary: Diffuse hepatic steatosis. Normal liver size. No
definite liver surface irregularity. Subcentimeter hypodense
inferior right liver lesion is too small to characterize and
unchanged, considered benign. No new liver lesions. Tiny layering
subcentimeter gallstones in the nondistended gallbladder. No
gallbladder wall thickening or pericholecystic fluid. No biliary
ductal dilatation.

Pancreas: Normal, with no mass or duct dilation.

Spleen: Normal size. No mass.

Adrenals/Urinary Tract: Normal adrenals. No renal stones. No
hydronephrosis. No contour deforming renal masses. Normal bladder.

Stomach/Bowel: Moderate hiatal hernia. Otherwise normal nondistended
stomach. Normal caliber small bowel with no small bowel wall
thickening. Normal appendix. Oral contrast transits to the distal
colon. Normal large bowel with no diverticulosis, large bowel wall
thickening or pericolonic fat stranding.

Vascular/Lymphatic: Atherosclerotic nonaneurysmal abdominal aorta.
No pathologically enlarged lymph nodes in the abdomen or pelvis.

Reproductive: Top-normal size prostate.

Other: No pneumoperitoneum, ascites or focal fluid collection.

Musculoskeletal: No aggressive appearing focal osseous lesions.
Moderate lumbar spondylosis.
IMPRESSION: 1. No acute abnormality. No evidence of bowel obstruction or acute
bowel inflammation. Normal appendix.
2. Moderate hiatal hernia.
3. Diffuse hepatic steatosis.
4. Cholelithiasis.
5. Aortic Atherosclerosis ([id number]).

## 2022-05-07 DIAGNOSIS — Z23 Encounter for immunization: Secondary | ICD-10-CM | POA: Diagnosis not present

## 2022-05-07 DIAGNOSIS — K219 Gastro-esophageal reflux disease without esophagitis: Secondary | ICD-10-CM | POA: Diagnosis not present

## 2022-05-07 DIAGNOSIS — I10 Essential (primary) hypertension: Secondary | ICD-10-CM | POA: Diagnosis not present

## 2022-05-09 DIAGNOSIS — H04123 Dry eye syndrome of bilateral lacrimal glands: Secondary | ICD-10-CM | POA: Diagnosis not present

## 2022-05-09 DIAGNOSIS — H1045 Other chronic allergic conjunctivitis: Secondary | ICD-10-CM | POA: Diagnosis not present

## 2022-05-09 DIAGNOSIS — H0102B Squamous blepharitis left eye, upper and lower eyelids: Secondary | ICD-10-CM | POA: Diagnosis not present

## 2022-05-09 DIAGNOSIS — H40013 Open angle with borderline findings, low risk, bilateral: Secondary | ICD-10-CM | POA: Diagnosis not present

## 2022-05-09 DIAGNOSIS — H0102A Squamous blepharitis right eye, upper and lower eyelids: Secondary | ICD-10-CM | POA: Diagnosis not present

## 2022-05-09 DIAGNOSIS — Z961 Presence of intraocular lens: Secondary | ICD-10-CM | POA: Diagnosis not present

## 2022-10-29 DIAGNOSIS — J309 Allergic rhinitis, unspecified: Secondary | ICD-10-CM | POA: Diagnosis not present

## 2022-10-29 DIAGNOSIS — I10 Essential (primary) hypertension: Secondary | ICD-10-CM | POA: Diagnosis not present

## 2022-10-29 DIAGNOSIS — K219 Gastro-esophageal reflux disease without esophagitis: Secondary | ICD-10-CM | POA: Diagnosis not present

## 2022-10-29 DIAGNOSIS — Z0000 Encounter for general adult medical examination without abnormal findings: Secondary | ICD-10-CM | POA: Diagnosis not present

## 2022-11-14 DIAGNOSIS — Z85828 Personal history of other malignant neoplasm of skin: Secondary | ICD-10-CM | POA: Diagnosis not present

## 2022-11-14 DIAGNOSIS — D1801 Hemangioma of skin and subcutaneous tissue: Secondary | ICD-10-CM | POA: Diagnosis not present

## 2022-11-14 DIAGNOSIS — D225 Melanocytic nevi of trunk: Secondary | ICD-10-CM | POA: Diagnosis not present

## 2022-11-14 DIAGNOSIS — D485 Neoplasm of uncertain behavior of skin: Secondary | ICD-10-CM | POA: Diagnosis not present

## 2022-11-14 DIAGNOSIS — L720 Epidermal cyst: Secondary | ICD-10-CM | POA: Diagnosis not present

## 2022-11-14 DIAGNOSIS — L821 Other seborrheic keratosis: Secondary | ICD-10-CM | POA: Diagnosis not present

## 2022-11-14 DIAGNOSIS — L578 Other skin changes due to chronic exposure to nonionizing radiation: Secondary | ICD-10-CM | POA: Diagnosis not present

## 2022-11-14 DIAGNOSIS — L723 Sebaceous cyst: Secondary | ICD-10-CM | POA: Diagnosis not present

## 2022-11-14 DIAGNOSIS — L570 Actinic keratosis: Secondary | ICD-10-CM | POA: Diagnosis not present

## 2023-03-28 DIAGNOSIS — U071 COVID-19: Secondary | ICD-10-CM | POA: Diagnosis not present

## 2023-03-28 DIAGNOSIS — R0981 Nasal congestion: Secondary | ICD-10-CM | POA: Diagnosis not present

## 2023-03-28 DIAGNOSIS — R509 Fever, unspecified: Secondary | ICD-10-CM | POA: Diagnosis not present

## 2023-03-28 DIAGNOSIS — R059 Cough, unspecified: Secondary | ICD-10-CM | POA: Diagnosis not present

## 2023-04-04 DIAGNOSIS — Z1152 Encounter for screening for COVID-19: Secondary | ICD-10-CM | POA: Diagnosis not present

## 2023-05-06 DIAGNOSIS — Z23 Encounter for immunization: Secondary | ICD-10-CM | POA: Diagnosis not present

## 2023-05-06 DIAGNOSIS — J309 Allergic rhinitis, unspecified: Secondary | ICD-10-CM | POA: Diagnosis not present

## 2023-05-06 DIAGNOSIS — I10 Essential (primary) hypertension: Secondary | ICD-10-CM | POA: Diagnosis not present

## 2023-05-06 DIAGNOSIS — K219 Gastro-esophageal reflux disease without esophagitis: Secondary | ICD-10-CM | POA: Diagnosis not present

## 2023-05-14 DIAGNOSIS — H04123 Dry eye syndrome of bilateral lacrimal glands: Secondary | ICD-10-CM | POA: Diagnosis not present

## 2023-05-14 DIAGNOSIS — H40013 Open angle with borderline findings, low risk, bilateral: Secondary | ICD-10-CM | POA: Diagnosis not present

## 2023-05-14 DIAGNOSIS — H0102B Squamous blepharitis left eye, upper and lower eyelids: Secondary | ICD-10-CM | POA: Diagnosis not present

## 2023-05-14 DIAGNOSIS — H1045 Other chronic allergic conjunctivitis: Secondary | ICD-10-CM | POA: Diagnosis not present

## 2023-05-14 DIAGNOSIS — H0102A Squamous blepharitis right eye, upper and lower eyelids: Secondary | ICD-10-CM | POA: Diagnosis not present

## 2023-05-14 DIAGNOSIS — Z961 Presence of intraocular lens: Secondary | ICD-10-CM | POA: Diagnosis not present

## 2023-10-30 DIAGNOSIS — Z0000 Encounter for general adult medical examination without abnormal findings: Secondary | ICD-10-CM | POA: Diagnosis not present

## 2023-10-30 DIAGNOSIS — I10 Essential (primary) hypertension: Secondary | ICD-10-CM | POA: Diagnosis not present

## 2023-10-30 DIAGNOSIS — J309 Allergic rhinitis, unspecified: Secondary | ICD-10-CM | POA: Diagnosis not present

## 2023-10-30 DIAGNOSIS — K219 Gastro-esophageal reflux disease without esophagitis: Secondary | ICD-10-CM | POA: Diagnosis not present

## 2023-11-29 DIAGNOSIS — I10 Essential (primary) hypertension: Secondary | ICD-10-CM | POA: Diagnosis not present

## 2023-12-03 DIAGNOSIS — L578 Other skin changes due to chronic exposure to nonionizing radiation: Secondary | ICD-10-CM | POA: Diagnosis not present

## 2023-12-03 DIAGNOSIS — L821 Other seborrheic keratosis: Secondary | ICD-10-CM | POA: Diagnosis not present

## 2023-12-03 DIAGNOSIS — D1801 Hemangioma of skin and subcutaneous tissue: Secondary | ICD-10-CM | POA: Diagnosis not present

## 2023-12-03 DIAGNOSIS — L820 Inflamed seborrheic keratosis: Secondary | ICD-10-CM | POA: Diagnosis not present

## 2023-12-03 DIAGNOSIS — L723 Sebaceous cyst: Secondary | ICD-10-CM | POA: Diagnosis not present

## 2023-12-03 DIAGNOSIS — L570 Actinic keratosis: Secondary | ICD-10-CM | POA: Diagnosis not present

## 2023-12-03 DIAGNOSIS — D225 Melanocytic nevi of trunk: Secondary | ICD-10-CM | POA: Diagnosis not present

## 2023-12-03 DIAGNOSIS — W57XXXA Bitten or stung by nonvenomous insect and other nonvenomous arthropods, initial encounter: Secondary | ICD-10-CM | POA: Diagnosis not present

## 2023-12-03 DIAGNOSIS — L719 Rosacea, unspecified: Secondary | ICD-10-CM | POA: Diagnosis not present

## 2023-12-03 DIAGNOSIS — Z85828 Personal history of other malignant neoplasm of skin: Secondary | ICD-10-CM | POA: Diagnosis not present

## 2024-01-20 DIAGNOSIS — H0102B Squamous blepharitis left eye, upper and lower eyelids: Secondary | ICD-10-CM | POA: Diagnosis not present

## 2024-01-20 DIAGNOSIS — Z961 Presence of intraocular lens: Secondary | ICD-10-CM | POA: Diagnosis not present

## 2024-01-20 DIAGNOSIS — H1045 Other chronic allergic conjunctivitis: Secondary | ICD-10-CM | POA: Diagnosis not present

## 2024-01-20 DIAGNOSIS — H00024 Hordeolum internum left upper eyelid: Secondary | ICD-10-CM | POA: Diagnosis not present

## 2024-01-20 DIAGNOSIS — H0102A Squamous blepharitis right eye, upper and lower eyelids: Secondary | ICD-10-CM | POA: Diagnosis not present

## 2024-01-20 DIAGNOSIS — H40013 Open angle with borderline findings, low risk, bilateral: Secondary | ICD-10-CM | POA: Diagnosis not present

## 2024-01-20 DIAGNOSIS — H04123 Dry eye syndrome of bilateral lacrimal glands: Secondary | ICD-10-CM | POA: Diagnosis not present

## 2024-01-29 DIAGNOSIS — I10 Essential (primary) hypertension: Secondary | ICD-10-CM | POA: Diagnosis not present

## 2024-02-04 DIAGNOSIS — H00024 Hordeolum internum left upper eyelid: Secondary | ICD-10-CM | POA: Diagnosis not present

## 2024-02-29 DIAGNOSIS — I10 Essential (primary) hypertension: Secondary | ICD-10-CM | POA: Diagnosis not present

## 2024-03-04 DIAGNOSIS — H00024 Hordeolum internum left upper eyelid: Secondary | ICD-10-CM | POA: Diagnosis not present

## 2024-03-30 DIAGNOSIS — I10 Essential (primary) hypertension: Secondary | ICD-10-CM | POA: Diagnosis not present

## 2024-04-08 DIAGNOSIS — H00024 Hordeolum internum left upper eyelid: Secondary | ICD-10-CM | POA: Diagnosis not present

## 2024-04-30 DIAGNOSIS — I10 Essential (primary) hypertension: Secondary | ICD-10-CM | POA: Diagnosis not present

## 2024-05-11 DIAGNOSIS — J309 Allergic rhinitis, unspecified: Secondary | ICD-10-CM | POA: Diagnosis not present

## 2024-05-11 DIAGNOSIS — N529 Male erectile dysfunction, unspecified: Secondary | ICD-10-CM | POA: Diagnosis not present

## 2024-05-11 DIAGNOSIS — Z23 Encounter for immunization: Secondary | ICD-10-CM | POA: Diagnosis not present

## 2024-05-11 DIAGNOSIS — K219 Gastro-esophageal reflux disease without esophagitis: Secondary | ICD-10-CM | POA: Diagnosis not present

## 2024-05-11 DIAGNOSIS — L308 Other specified dermatitis: Secondary | ICD-10-CM | POA: Diagnosis not present

## 2024-05-11 DIAGNOSIS — I10 Essential (primary) hypertension: Secondary | ICD-10-CM | POA: Diagnosis not present

## 2024-05-30 DIAGNOSIS — I10 Essential (primary) hypertension: Secondary | ICD-10-CM | POA: Diagnosis not present
